# Patient Record
Sex: Female | Born: 1950 | Race: White | Hispanic: No | Marital: Married | Smoking: Former smoker
Health system: Southern US, Community
[De-identification: ages and names within clinical notes are randomized; demographics above are authoritative.]

## PROBLEM LIST (undated history)

## (undated) DIAGNOSIS — R112 Nausea with vomiting, unspecified: Secondary | ICD-10-CM

## (undated) DIAGNOSIS — Z973 Presence of spectacles and contact lenses: Secondary | ICD-10-CM

## (undated) DIAGNOSIS — H269 Unspecified cataract: Secondary | ICD-10-CM

## (undated) DIAGNOSIS — Z9889 Other specified postprocedural states: Secondary | ICD-10-CM

## (undated) DIAGNOSIS — H35039 Hypertensive retinopathy, unspecified eye: Secondary | ICD-10-CM

## (undated) DIAGNOSIS — I8390 Asymptomatic varicose veins of unspecified lower extremity: Secondary | ICD-10-CM

## (undated) DIAGNOSIS — E039 Hypothyroidism, unspecified: Secondary | ICD-10-CM

## (undated) DIAGNOSIS — K5792 Diverticulitis of intestine, part unspecified, without perforation or abscess without bleeding: Secondary | ICD-10-CM

## (undated) HISTORY — PX: COLONOSCOPY: SHX174

## (undated) HISTORY — DX: Unspecified cataract: H26.9

## (undated) HISTORY — PX: ABDOMINAL HYSTERECTOMY: SHX81

## (undated) HISTORY — PX: RECTAL SURGERY: SHX760

## (undated) HISTORY — DX: Asymptomatic varicose veins of unspecified lower extremity: I83.90

## (undated) HISTORY — DX: Hypothyroidism, unspecified: E03.9

## (undated) HISTORY — DX: Hypertensive retinopathy, unspecified eye: H35.039

## (undated) HISTORY — PX: ROTATOR CUFF REPAIR: SHX139

## (undated) HISTORY — PX: APPENDECTOMY: SHX54

---
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
## 1995-01-14 HISTORY — PX: CHOLECYSTECTOMY: SHX55

## 1999-06-11 ENCOUNTER — Ambulatory Visit (HOSPITAL_COMMUNITY): Admission: RE | Admit: 1999-06-11 | Discharge: 1999-06-11 | Payer: Self-pay | Admitting: Gastroenterology

## 2000-03-31 ENCOUNTER — Encounter: Payer: Self-pay | Admitting: Family Medicine

## 2000-03-31 ENCOUNTER — Encounter: Admission: RE | Admit: 2000-03-31 | Discharge: 2000-03-31 | Payer: Self-pay | Admitting: Family Medicine

## 2001-04-21 ENCOUNTER — Other Ambulatory Visit: Admission: RE | Admit: 2001-04-21 | Discharge: 2001-04-21 | Payer: Self-pay | Admitting: Obstetrics and Gynecology

## 2003-04-27 ENCOUNTER — Encounter: Admission: RE | Admit: 2003-04-27 | Discharge: 2003-04-27 | Payer: Self-pay | Admitting: Family Medicine

## 2004-07-02 ENCOUNTER — Other Ambulatory Visit: Admission: RE | Admit: 2004-07-02 | Discharge: 2004-07-02 | Payer: Self-pay | Admitting: Obstetrics and Gynecology

## 2006-05-07 ENCOUNTER — Encounter: Admission: RE | Admit: 2006-05-07 | Discharge: 2006-05-07 | Payer: Self-pay | Admitting: Family Medicine

## 2007-01-14 HISTORY — PX: OTHER SURGICAL HISTORY: SHX169

## 2007-04-19 ENCOUNTER — Encounter (INDEPENDENT_AMBULATORY_CARE_PROVIDER_SITE_OTHER): Payer: Self-pay | Admitting: Surgery

## 2007-04-19 ENCOUNTER — Encounter: Admission: RE | Admit: 2007-04-19 | Discharge: 2007-04-19 | Payer: Self-pay | Admitting: Family Medicine

## 2007-04-19 ENCOUNTER — Observation Stay (HOSPITAL_COMMUNITY): Admission: EM | Admit: 2007-04-19 | Discharge: 2007-04-20 | Payer: Self-pay | Admitting: Emergency Medicine

## 2007-07-01 ENCOUNTER — Ambulatory Visit (HOSPITAL_COMMUNITY): Admission: RE | Admit: 2007-07-01 | Discharge: 2007-07-02 | Payer: Self-pay | Admitting: Obstetrics and Gynecology

## 2007-07-01 ENCOUNTER — Encounter (INDEPENDENT_AMBULATORY_CARE_PROVIDER_SITE_OTHER): Payer: Self-pay | Admitting: Obstetrics and Gynecology

## 2009-07-25 ENCOUNTER — Encounter: Admission: RE | Admit: 2009-07-25 | Discharge: 2009-07-25 | Payer: Self-pay | Admitting: Family Medicine

## 2010-02-26 ENCOUNTER — Other Ambulatory Visit: Payer: Self-pay | Admitting: Family Medicine

## 2010-02-26 DIAGNOSIS — K760 Fatty (change of) liver, not elsewhere classified: Secondary | ICD-10-CM

## 2010-02-26 DIAGNOSIS — N281 Cyst of kidney, acquired: Secondary | ICD-10-CM

## 2010-03-20 ENCOUNTER — Other Ambulatory Visit: Payer: Self-pay | Admitting: Family Medicine

## 2010-03-20 DIAGNOSIS — K760 Fatty (change of) liver, not elsewhere classified: Secondary | ICD-10-CM

## 2010-03-21 ENCOUNTER — Other Ambulatory Visit: Payer: Self-pay

## 2010-03-25 ENCOUNTER — Ambulatory Visit
Admission: RE | Admit: 2010-03-25 | Discharge: 2010-03-25 | Disposition: A | Discharge status: Home / Self Care | Payer: BC Managed Care – PPO | Source: Ambulatory Visit | Attending: Family Medicine | Admitting: Family Medicine

## 2010-03-25 DIAGNOSIS — K760 Fatty (change of) liver, not elsewhere classified: Secondary | ICD-10-CM

## 2010-03-25 MED ORDER — GADOBENATE DIMEGLUMINE 529 MG/ML IV SOLN
17.0000 mL | Freq: Once | INTRAVENOUS | Status: AC | PRN
  Administered 2010-03-25: 17 mL via INTRAVENOUS

## 2010-05-28 NOTE — Op Note (Signed)
NAME:  Heather Allison, Heather Allison NO.:  [id number]   MEDICAL RECORD NO.:  [id number]          PATIENT TYPE:  OIB   LOCATION:  9315                          FACILITY:  [facility]   PHYSICIAN:  Miguel Aschoff, M.D.       DATE OF BIRTH:  Dec 30, 1950   DATE OF PROCEDURE:  07/01/2007  DATE OF DISCHARGE:                               OPERATIVE REPORT   PREOPERATIVE DIAGNOSIS:  A 5 cm right ovarian mass.   POSTOPERATIVE DIAGNOSIS:  A 5 cm right ovarian mass.   PROCEDURE:  Laparoscopic right oophorectomy and left salpingo-  oophorectomy.   SURGEON:  Miguel Aschoff, MD   ANESTHESIA:  General.   ASSISTANT:  Luvenia Redden, MD   ANESTHESIA:  General.   COMPLICATIONS:  None.   JUSTIFICATION:  The patient is a 60 year old white female who underwent  emergency appendectomy and at the time of her appendectomy was noted to  have a 5-6 cm mass involving the right ovary.  Ultrasound examination  revealed this to be a solitary cyst.  CA-125 level was normal.  Because  of the persistence of this mass and the possibility of it representing a  cystoadenoma, the patient is being brought to the operating room at this  time to undergo laparoscopic bilateral salpingo-oophorectomy due to her  menopausal state and this large right ovarian mass.  The risks and  benefits of this procedure were discussed with the patient.  In  addition, the patient has a sebaceous cyst on her left lower abdomen has  requested that this be treated at the time of her pelvic surgery.  Informed consent has been obtained.   PROCEDURE:  The patient was taken to the operating room and placed in  the supine position.  General anesthesia was administered without  difficulty.  She was then placed in modified lithotomy position, prepped  and draped in the usual sterile fashion.  The bladder was catheterized.  At this point, attention was directed to the umbilicus.  A small  infraumbilical incision was made.  A Veress needle was inserted  and then  the abdomen was insufflated with 3 L of CO2.  Following the  insufflation, the trocar to laparoscope was introduced followed by  laparoscope itself.  Then under direct visualization, a 5-mm port was  established in the right lower quadrant and an 11-mm port established in  the left lower quadrant.  Inspection revealed the presence of the right  adnexal cystic mass again approximately 5-6 cm in size somewhat adherent  to the right lateral pelvic sidewall and the appendices epiploicae of  the bowel.  On the left side, the ovary was within normal limits,  however, again adherent to the appendices epiploicae of the sigmoid  colon.  At this point, the adhesions holding the appendices epiploicae  to the ovaries were grasped with the Gyrus unit, cauterized and cut  freeing the ovaries and on the right side, the infundibulopelvic  ligament was identified.  The ovary elevated and placed on stretch.  The  ureter identified with the ureter being out of the field.  The  infundibulopelvic ligament  was cauterized and cut and dissection  continued along the  mesovarian ligament with the Gyrus unit with serial  cauterization and cuts.  There were filmy adhesions holding the ovary to  the lateral side wall and it was possible to free these adhesions  entirely without difficulty.  It was then possible to continue the  dissection until the ovary was entirely freed off the pelvic sidewall  via cauterization and cuts.  There was excellent hemostasis in this  region.  The ovaries were then placed in the cul-de-sac.  Attention was  then directed to the right tube and ovary.  The residual adhesions  holding the ovary and tube place were then grasped, elevated, cauterized  and cut and then once this was done, the infundibulopelvic ligament  again was identified as was the ureter and then the infundibular pelvic  ligaments were cauterized, cut and dissection continued again along the  mesovarian ligament  until it was possible to free the ovary entirely.  This ovary and tube were then placed in the cul-de-sac.  Inspection was  then made for hemostasis.  There was a small amount of oozing noted in  area where an appendix epiploica had been freed from the ovary and this  was grasped with a Gyrus unit, cauterized and hemostasis readily  achieved.  At this point,  the Endocatch unit was introduced into the  abdomen and both ovaries were placed in the Endocatch unit and was  brought out through the left lower quadrant incision with the ovaries  now being confined in the Endocatch bag.  The cystic mass was  decompressed and then it was possible to remove the ovaries without  difficulty through the 11 mm incision.  Final inspection made of the  pelvis, it was irrigated with saline via the Nezhat unit.  Hemostasis  appeared to be excellent.  At this point, this portion of the procedure  was completed.  The CO2 was allowed to escape and small incisions were  closed.  The periumbilical incision and the right lower quadrant  incision were closed with subcuticular 3-0 Vicryl.  The left lower  quadrant incision was closed with subcutaneous 2-0 Vicryl and the skin  was closed with 3-0 Vicryl subcutaneous suture.  Then the small  sebaceous cyst noted in the left upper quadrant was then identified,  opened and the sebaceous material drained without difficulty and the  cyst taking care.  Band-Aids were applied.  At this point, the procedure  was completed.  The patient reversed from the anesthetic and taken to  recovery room in satisfactory condition.  Prior to waking the patient,  the port sites were injected with 0.25% Marcaine, total of 10 mL were  used.  The estimated intraoperative blood loss was less than 30 mL.   Plan is for the patient to be observed overnight.  She will be  discharged home on July 02, 2007.  The patient tolerated the procedure  well.      Miguel Aschoff, M.D.  Electronically  Signed     AR/MEDQ  D:  07/01/2007  T:  07/01/2007  Job:  [id number]

## 2010-05-28 NOTE — Op Note (Signed)
Heather Allison, Heather Allison NO.:  [id number]   MEDICAL RECORD NO.:  [id number]          PATIENT TYPE:  INP   LOCATION:  1334                         FACILITY:  [facility]   PHYSICIAN:  Thornton Park. Daphine Deutscher, MD  DATE OF BIRTH:  [birth year]   DATE OF PROCEDURE:  04/19/2007  DATE OF DISCHARGE:                               OPERATIVE REPORT   PREOPERATIVE DIAGNOSIS:  Acute appendicitis and 5 cm right ovarian mass.   POSTOPERATIVE DIAGNOSIS:  Acute appendicitis and 5 cm right ovarian  mass.   PROCEDURE:  Laparoscopic appendectomy   SURGEON:  Dr. Luretha Murphy.   ASSISTANT:  None.   ANESTHESIA:  General endotracheal.   Photographs so were given to the patient's husband and are on the back  of my operative note that depict this 5 cm cystic-appearing ovary on the  right side.  She has had a previous partial hysterectomy.   DESCRIPTION OF PROCEDURE:  [name] was taken to room one on Monday,  April 6 and given general anesthesia.  Her abdomen was prepped with  Techni-Care and draped sterilely.  A longitudinal incision was made down  to the umbilicus. We entered the abdomen without difficulty and inserted  the Hasson cannula.  Once insufflated, the appendix was mobilized. It  was very stuck, but it was stuck laterally and it seemed to double back  on itself and I appeared to have gotten it near the base and I went  ahead and resected it mobilizing it and then stapling across it.  However, once I started looking at that, I realized that the appendix  had somewhat of a retro extraperitoneal portion and I was able to go  back farther really to more of the base proper and mobilized it and then  divided with the Endo-GIA vascular load and then removed it with the  Harmonic scalpel.  I placed it to in a bag and  removed it.  I irrigated with saline.  No bleeding was noted.  I  repaired the umbilical defect with the angled scope looking up at the  defect.  The patient was then  decompressed.  No bleeding was noted, I  removed the trocars, closed the skin with 4-0 Vicryl.  The patient was  taken to the recovery room in satisfactory addition.      Thornton Park Daphine Deutscher, MD  Electronically Signed     MBM/MEDQ  D:  04/19/2007  T:  04/20/2007  Job:  [id number]   cc:   Miguel Aschoff, M.D.  Fax: [phone]   Gretta Arab Valentina Lucks, M.D.  Fax: [phone]

## 2010-05-31 NOTE — Procedures (Signed)
[facility]  Patient:    Heather Allison, Heather Allison                      MRN: [id number] Adm. Date:  [id number] Disc. Date: [id number] Attending:  Deneen Harts CC:         Miguel Aschoff, M.D., OB/GYN             Dr. Lupe Carney                           Procedure Report  PROCEDURE:  Colonoscopy.  INDICATION:  A 60 year old white female found to have Hemoccult-positive stool on routine examination by Dr. Tenny Craw.  Significant family history of father with colon cancer.  Patient undergoing initial neoplasia surveillance.  She is asymptomatic except for intermittent abdominal cramping.  DESCRIPTION OF PROCEDURE:  After reviewing the nature of the procedure with the patient, including potential risks and complications, and after discussing alternative methods of diagnosis and treatment, informed consent was signed.  The patient was premedicated and was given IV sedation totalling Versed 6 mg, fentanyl 75 mcg administered in divided doses prior to and during the course of the procedure.  Using an Olympus PCF-100 video colonoscope, the rectum was intubated after a normal digital examination.  The scope was inserted and advanced under direct vision around the entire length of the colon to the cecum, identified by the appendiceal orifice and the ileocecal valve.  Photo documentation obtained. Preparation was excellent throughout.  The scope was slowly withdrawn with careful inspection of the entire colon in a retrograde manner, including retroflexed view in the rectal vault.  The only abnormality noted was early hemorrhoids.  These are thought to be the probable explanation for Hemoccult-positive stool.  No evidence of neoplasia. The colon was decompressed, scope withdrawn.  The patient tolerated the procedure without difficulty, being maintained in Dayscope monitor, low-flow oxygen throughout.  Time 1, technical 1, preparation 1, total score equal to  3.  ASSESSMENT: 1. Normal colonoscopy. 2. Internal hemorrhoids, early, possible source of Hemoccult-positive stool. 3. No colorectal neoplasia.  RECOMMENDATIONS: 1. Annual Hemoccult. 2. Repeat colonoscopy in five years because of family history. 3. Rectal care p.r.n. DD:  06/11/99 TD:  06/13/99 Job: [id number]

## 2010-05-31 NOTE — Discharge Summary (Signed)
NAME:  Heather Allison, PROBASCO NO.:  [id number]   MEDICAL RECORD NO.:  [id number]          PATIENT TYPE:  OIB   LOCATION:  9315                          FACILITY:  [facility]   PHYSICIAN:  Miguel Aschoff, M.D.       DATE OF BIRTH:  1950/10/12   DATE OF ADMISSION:  07/01/2007  DATE OF DISCHARGE:  07/02/2007                               DISCHARGE SUMMARY   ADMISSION DIAGNOSIS:  Complex right adnexal mass.   BRIEF HISTORY:  The patient is a 60 year old white female who underwent  an emergency appendectomy by Dr. Wenda Low, and at the time of this  surgery, was noted to have a large mass in the right pelvis.  Ultrasound  examination revealed this to be a simple cyst.  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   HOSPITAL COURSE:  Preoperative studies were obtained, and then on July 01, 2007, under general anesthesia, a laparoscopic bilateral salpingo-  oophorectomy was carried out without difficulty.  The patient tolerated  the surgical procedure well.  She had an uncomplicated postoperative  course.  She tolerated increasing ambulation and diet well, and by the  end of the first postoperative day, she was in satisfactory condition  and able to be discharged home.   Medications for home included Darvocet-N 100 one every 4 hours as needed  for pain.  She was instructed on no heavy lifting, to call if there are  any problems such as fever, pain, or heavy bleeding.  The pathology  report on the right ovarian mass revealed it to be a benign serous cyst.  The left ovary revealed benign cortical cyst and a benign serous cyst.      Miguel Aschoff, M.D.  Electronically Signed     AR/MEDQ  D:  08/11/2007  T:  08/11/2007  Job:  [id number]

## 2010-10-08 LAB — CBC
MCHC: 35.6
MCV: 93.4
RBC: 3.77 — ABNORMAL LOW
RDW: 12.9

## 2010-10-10 LAB — APTT: aPTT: 30

## 2010-10-10 LAB — URINE MICROSCOPIC-ADD ON

## 2010-10-10 LAB — COMPREHENSIVE METABOLIC PANEL
ALT: 46 — ABNORMAL HIGH
AST: 34
Albumin: 3.7
CO2: 25
Calcium: 9.8
Chloride: 108
Creatinine, Ser: 0.7
GFR calc Af Amer: >60
Sodium: 140

## 2010-10-10 LAB — URINALYSIS, ROUTINE W REFLEX MICROSCOPIC
Glucose, UA: NEGATIVE
Leukocytes, UA: NEGATIVE
Nitrite: NEGATIVE
Specific Gravity, Urine: 1.025
pH: 5.5

## 2010-10-10 LAB — CBC
HCT: 34.3 — ABNORMAL LOW
Hemoglobin: 11.8 — ABNORMAL LOW
MCHC: 34.4
MCHC: 34.9
MCV: 95.5
MCV: 96.2
Platelets: 238
RBC: 4.2
RDW: 12.7
WBC: 9.1

## 2011-01-14 HISTORY — PX: FOOT SURGERY: SHX648

## 2011-02-25 ENCOUNTER — Other Ambulatory Visit: Payer: Self-pay | Admitting: Obstetrics and Gynecology

## 2012-07-12 ENCOUNTER — Other Ambulatory Visit: Payer: Self-pay | Admitting: Dermatology

## 2013-03-30 ENCOUNTER — Other Ambulatory Visit: Payer: Self-pay | Admitting: *Deleted

## 2013-03-30 DIAGNOSIS — I83893 Varicose veins of bilateral lower extremities with other complications: Secondary | ICD-10-CM

## 2013-05-30 ENCOUNTER — Encounter: Payer: Self-pay | Admitting: Vascular Surgery

## 2013-05-31 ENCOUNTER — Ambulatory Visit (HOSPITAL_COMMUNITY)
Admission: RE | Admit: 2013-05-31 | Discharge: 2013-05-31 | Disposition: A | Discharge status: Home / Self Care | Payer: BC Managed Care – PPO | Source: Ambulatory Visit | Attending: Vascular Surgery | Admitting: Vascular Surgery

## 2013-05-31 ENCOUNTER — Ambulatory Visit (INDEPENDENT_AMBULATORY_CARE_PROVIDER_SITE_OTHER): Payer: BC Managed Care – PPO | Admitting: Vascular Surgery

## 2013-05-31 ENCOUNTER — Encounter: Payer: Self-pay | Admitting: Vascular Surgery

## 2013-05-31 VITALS — BP 136/75 | HR 75 | Resp 16 | Ht 67.0 in | Wt 212.0 lb

## 2013-05-31 DIAGNOSIS — I8390 Asymptomatic varicose veins of unspecified lower extremity: Secondary | ICD-10-CM | POA: Insufficient documentation

## 2013-05-31 DIAGNOSIS — I83893 Varicose veins of bilateral lower extremities with other complications: Secondary | ICD-10-CM

## 2013-05-31 DIAGNOSIS — I781 Nevus, non-neoplastic: Secondary | ICD-10-CM

## 2013-05-31 NOTE — Progress Notes (Signed)
Subjective:     Patient ID: Heather Allison, female   DOB: 04/16/1950, 63 y.o.   MRN: [id number]  HPI this 63 year old female complains of her legs being "restless" in the evenings when she is sitting watching television with the legs propped up. This does not occur while walking during the daytime or while sleeping at night. It is relieved by her ambulating. She has no pain in the lower extremities. She does have occasional mild edema. She has no history of bulging varicose veins, DVT, thrombophlebitis, stasis ulcers, or bleeding. She does not wear elastic compression stockings. She has noticed bladder veins throughout both eyes and calf areas.  Past Medical History  Diagnosis Date  . Hypothyroidism   . Varicose veins     History  Substance Use Topics  . Smoking status: Former Smoker    Quit date: 01/14/2008  . Smokeless tobacco: Not on file  . Alcohol Use: No    History reviewed. No pertinent family history.  Allergies  Allergen Reactions  . Codeine   . Erythromycin     Current outpatient prescriptions:levothyroxine (SYNTHROID, LEVOTHROID) 125 MCG tablet, Take 125 mcg by mouth daily before breakfast., Disp: , Rfl:   BP 136/75  Pulse 75  Resp 16  Ht 5\' 7"  (1.702 m)  Wt 212 lb (96.163 kg)  BMI 33.20 kg/m2  Body mass index is 33.2 kg/(m^2).          Review of Systems denies chest pain, dyspnea on exertion, PND, orthopnea, hemoptysis, claudication, or any other symptoms in a complete review of systems     Objective:   Physical Exam BP 136/75  Pulse 75  Resp 16  Ht 5\' 7"  (1.702 m)  Wt 212 lb (96.163 kg)  BMI 33.20 kg/m2  Gen.-alert and oriented x3 in no apparent distress HEENT normal for age Lungs no rhonchi or wheezing Cardiovascular regular rhythm no murmurs carotid pulses 3+ palpable no bruits audible Abdomen soft nontender no palpable masses Musculoskeletal free of  major deformities Skin clear -no rashes Neurologic normal Lower extremities 3+ femoral  and dorsalis pedis pulses palpable bilaterally with no edema Patient has spider veins in the right lateral thigh and calf on the left posterior calf and popliteal fossa. Nobles and varicosities noted.  Today I ordered bilateral venous duplex exam which I reviewed and interpreted. There is no DVT. There is no superficial reflux in the greater or small saphenous veins.        Assessment:     #1 diffuse spider veins  bilateral lower extremities-asymptomatic #2 restless legs while sitting and legs in the elevated position-etiology unknown #3 no evidence of arterial venous pathology other than spider veins    Plan:     Have discussed sclerotherapy of spider veins  If the patient is interested she will notify us

## 2013-06-13 ENCOUNTER — Other Ambulatory Visit: Payer: Self-pay | Admitting: Dermatology

## 2013-08-08 ENCOUNTER — Other Ambulatory Visit: Payer: Self-pay | Admitting: Physician Assistant

## 2013-08-22 ENCOUNTER — Encounter (HOSPITAL_BASED_OUTPATIENT_CLINIC_OR_DEPARTMENT_OTHER): Payer: Self-pay | Admitting: *Deleted

## 2013-08-22 NOTE — Progress Notes (Signed)
No labs needed-no heart or resp problems

## 2013-08-24 NOTE — H&P (Signed)
[facility] [address]   [address] [place], [place] [phone] A Division of [facility]  Loreta Ave, M.D.   Robert A. Thurston Hole, M.D.   Burnell Blanks, M.D.   Eulas Post, M.D.   Lunette Stands, M.D. Jewel Baize. Eulah Pont, M.D.  Buford Dresser, M.D.  Estell Harpin, M.D.    Melina Fiddler, M.D. Mary L. Isidoro Donning, PA-C  Kirstin A. Shepperson, PA-C  Josh [place], PA-C [place], [place]  RE: [place], [place]   [phone]      DOB: December 08, 1950 INITIAL EVALUATION: 07-05-13  HPI: Story is a new patient to the office.  She is a healthy 63 year old female.  She developed sharp, catching symptoms of the right knee about six months ago after a very minor twist.  Getting worse rather than better.  Persistent medial mechanical symptoms.  Can no longer squat or pivot.  Pseudo-locking.  Not a lot of swelling.  No specific trauma.  No other joint complaints.  Otherwise good health.    Her history, general exam are reviewed and updated and included in the chart.   EXAMINATION: Specifically a healthy appearing 63 year old female.  5'7". 210 pounds.  A little bit of an antalgic gait on the right.  Neurovascularly intact in the upper and lower extremities.  Negative straight leg both sides. Negative log roll both hips.  Right knee point tender medial joint line, positive medial McMurray's.  Full motion, stable ligaments.  Not a lot of grating or crepitus.  Patellar tracking is reasonable.  No atrophy.  Neurovascularly intact distally.  Opposite left knee is a little sore medially but not that dramatic.  A little sore over the pes bursa on the right but again the joint line tenderness is most profound.    X-RAYS: 4-view standing x-ray reveals some mild narrowing in the medial compartment on flexion views, not extreme.  IMPRESSION and PLAN: Degenerative medial meniscus tear right knee.  Symptoms for six months.  Enough  mechanical symptoms to warrant investigation. MRI to look at structures.  Followup with me when that is complete.  We talked about potential for arthroscopic debridement depending on her scan.  Final decision after we see the results.  She understands and agrees.     Loreta Ave, M.D. Electronically verified by Loreta Ave, M.D. DFM:gde D 07-05-13         T 07-06-13  [facility] [address]   [address] [place], [place] [address] [phone] A Division of [facility]  Loreta Ave, M.D.   Robert A. Thurston Hole, M.D.   Burnell Blanks, M.D.   Eulas Post, M.D.   Lunette Stands, M.D Jewel Baize. Eulah Pont, M.D.  Buford Dresser, M.D.  Estell Harpin, M.D.    Melina Fiddler, M.D. Mary L. Isidoro Donning, PA-C  Kirstin A. Shepperson, PA-C  Josh [place], PA-C [place], [place]   RE: [place], [place]   [phone]      DOB: October 04, 1950 PROGRESS NOTE: 07-26-13 Emillia comes in for her left knee. MRI is complete, I reviewed the scan and report and discussed it with her. She has a radial tear posterior horn medial meniscus. A little degenerative change in the ACL with partial tear or ACL cyst. 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  EXAMINATION: Her ACL has a little more excursion but this is equal on both sides and I'm not seeing significant laxity. Continued marked medial meniscal signs.  DISPOSITION: More than 25 minutes spent face-to-face covering everything with her. Plan exam under anesthesia arthroscopy care of her meniscus tear. I don't think we need to do anything with her ACL and she understands and agrees. Paperwork complete. 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  Discussed risks benefits and possible complications and she understands.   Loreta Ave, M.D.  Electronically verified by Loreta Ave, M.D. DFM:kah D 07-26-13 T 07-27-13

## 2013-08-25 ENCOUNTER — Ambulatory Visit (HOSPITAL_BASED_OUTPATIENT_CLINIC_OR_DEPARTMENT_OTHER)
Admission: RE | Admit: 2013-08-25 | Discharge: 2013-08-25 | Disposition: A | Discharge status: Home / Self Care | Payer: BC Managed Care – PPO | Source: Ambulatory Visit | Attending: Orthopedic Surgery | Admitting: Orthopedic Surgery

## 2013-08-25 ENCOUNTER — Encounter (HOSPITAL_BASED_OUTPATIENT_CLINIC_OR_DEPARTMENT_OTHER): Payer: Self-pay | Admitting: *Deleted

## 2013-08-25 ENCOUNTER — Encounter (HOSPITAL_BASED_OUTPATIENT_CLINIC_OR_DEPARTMENT_OTHER)
Admission: RE | Disposition: A | Discharge status: Home / Self Care | Payer: Self-pay | Source: Ambulatory Visit | Attending: Orthopedic Surgery

## 2013-08-25 ENCOUNTER — Ambulatory Visit (HOSPITAL_BASED_OUTPATIENT_CLINIC_OR_DEPARTMENT_OTHER): Payer: BC Managed Care – PPO | Admitting: Anesthesiology

## 2013-08-25 ENCOUNTER — Encounter (HOSPITAL_BASED_OUTPATIENT_CLINIC_OR_DEPARTMENT_OTHER): Payer: BC Managed Care – PPO | Admitting: Anesthesiology

## 2013-08-25 DIAGNOSIS — Z87891 Personal history of nicotine dependence: Secondary | ICD-10-CM | POA: Diagnosis not present

## 2013-08-25 DIAGNOSIS — M2240 Chondromalacia patellae, unspecified knee: Secondary | ICD-10-CM | POA: Insufficient documentation

## 2013-08-25 DIAGNOSIS — X500XXA Overexertion from strenuous movement or load, initial encounter: Secondary | ICD-10-CM | POA: Insufficient documentation

## 2013-08-25 DIAGNOSIS — G2581 Restless legs syndrome: Secondary | ICD-10-CM | POA: Diagnosis not present

## 2013-08-25 DIAGNOSIS — E039 Hypothyroidism, unspecified: Secondary | ICD-10-CM | POA: Diagnosis not present

## 2013-08-25 DIAGNOSIS — Z885 Allergy status to narcotic agent status: Secondary | ICD-10-CM | POA: Diagnosis not present

## 2013-08-25 DIAGNOSIS — Z881 Allergy status to other antibiotic agents status: Secondary | ICD-10-CM | POA: Diagnosis not present

## 2013-08-25 DIAGNOSIS — IMO0002 Reserved for concepts with insufficient information to code with codable children: Secondary | ICD-10-CM | POA: Insufficient documentation

## 2013-08-25 DIAGNOSIS — I83893 Varicose veins of bilateral lower extremities with other complications: Secondary | ICD-10-CM | POA: Diagnosis not present

## 2013-08-25 DIAGNOSIS — M9420 Chondromalacia, unspecified site: Secondary | ICD-10-CM | POA: Diagnosis not present

## 2013-08-25 HISTORY — PX: KNEE ARTHROSCOPY WITH MEDIAL MENISECTOMY: SHX5651

## 2013-08-25 HISTORY — DX: Other specified postprocedural states: R11.2

## 2013-08-25 HISTORY — DX: Other specified postprocedural states: Z98.890

## 2013-08-25 HISTORY — DX: Presence of spectacles and contact lenses: Z97.3

## 2013-08-25 LAB — POCT HEMOGLOBIN-HEMACUE: HEMOGLOBIN: 14 g/dL (ref 12.0–15.0)

## 2013-08-25 SURGERY — ARTHROSCOPY, KNEE, WITH MEDIAL MENISCECTOMY
Anesthesia: General | Site: Knee | Laterality: Left

## 2013-08-25 MED ORDER — METOCLOPRAMIDE HCL 5 MG PO TABS: 5.0000 mg | ORAL_TABLET | Freq: Three times a day (TID) | ORAL | Status: DC | PRN

## 2013-08-25 MED ORDER — CHLORHEXIDINE GLUCONATE 4 % EX LIQD: 60.0000 mL | Freq: Once | CUTANEOUS | Status: DC

## 2013-08-25 MED ORDER — ONDANSETRON HCL 4 MG PO TABS: 4.0000 mg | ORAL_TABLET | Freq: Four times a day (QID) | ORAL | Status: DC | PRN

## 2013-08-25 MED ORDER — OXYCODONE-ACETAMINOPHEN 5-325 MG PO TABS: 1.0000 | ORAL_TABLET | ORAL | Status: DC | PRN

## 2013-08-25 MED ORDER — DEXAMETHASONE SODIUM PHOSPHATE 4 MG/ML IJ SOLN
INTRAMUSCULAR | Status: DC | PRN
  Administered 2013-08-25: 10 mg via INTRAVENOUS

## 2013-08-25 MED ORDER — MIDAZOLAM HCL 5 MG/5ML IJ SOLN
INTRAMUSCULAR | Status: DC | PRN
  Administered 2013-08-25: 2 mg via INTRAVENOUS

## 2013-08-25 MED ORDER — ONDANSETRON HCL 4 MG/2ML IJ SOLN: 4.0000 mg | Freq: Four times a day (QID) | INTRAMUSCULAR | Status: DC | PRN

## 2013-08-25 MED ORDER — LACTATED RINGERS IV SOLN
INTRAVENOUS | Status: DC
  Administered 2013-08-25: 09:00:00 via INTRAVENOUS

## 2013-08-25 MED ORDER — MIDAZOLAM HCL 2 MG/2ML IJ SOLN
INTRAMUSCULAR | Status: AC
  Filled 2013-08-25: qty 2

## 2013-08-25 MED ORDER — FENTANYL CITRATE 0.05 MG/ML IJ SOLN
INTRAMUSCULAR | Status: DC | PRN
  Administered 2013-08-25: 25 ug via INTRAVENOUS
  Administered 2013-08-25: 100 ug via INTRAVENOUS
  Administered 2013-08-25: 25 ug via INTRAVENOUS

## 2013-08-25 MED ORDER — METHYLPREDNISOLONE ACETATE 80 MG/ML IJ SUSP
INTRAMUSCULAR | Status: AC
  Filled 2013-08-25: qty 1

## 2013-08-25 MED ORDER — PROPOFOL INFUSION 10 MG/ML OPTIME
INTRAVENOUS | Status: DC | PRN
  Administered 2013-08-25: 200 ug/kg/min via INTRAVENOUS

## 2013-08-25 MED ORDER — LIDOCAINE HCL (CARDIAC) 20 MG/ML IV SOLN
INTRAVENOUS | Status: DC | PRN
  Administered 2013-08-25: 100 mg via INTRAVENOUS

## 2013-08-25 MED ORDER — FENTANYL CITRATE 0.05 MG/ML IJ SOLN
INTRAMUSCULAR | Status: AC
  Filled 2013-08-25: qty 4

## 2013-08-25 MED ORDER — SCOPOLAMINE 1 MG/3DAYS TD PT72
1.0000 | MEDICATED_PATCH | Freq: Once | TRANSDERMAL | Status: DC
  Administered 2013-08-25: 1.5 mg via TRANSDERMAL

## 2013-08-25 MED ORDER — HYDROMORPHONE HCL PF 1 MG/ML IJ SOLN: 0.5000 mg | INTRAMUSCULAR | Status: DC | PRN

## 2013-08-25 MED ORDER — METOCLOPRAMIDE HCL 5 MG/ML IJ SOLN: 5.0000 mg | Freq: Three times a day (TID) | INTRAMUSCULAR | Status: DC | PRN

## 2013-08-25 MED ORDER — SODIUM CHLORIDE 0.9 % IR SOLN
Status: DC | PRN
  Administered 2013-08-25: 6000 mL/h

## 2013-08-25 MED ORDER — LACTATED RINGERS IV SOLN
INTRAVENOUS | Status: DC
  Administered 2013-08-25: 08:00:00 via INTRAVENOUS

## 2013-08-25 MED ORDER — CEFAZOLIN SODIUM-DEXTROSE 2-3 GM-% IV SOLR: 2.0000 g | INTRAVENOUS | Status: DC

## 2013-08-25 MED ORDER — KETOROLAC TROMETHAMINE 30 MG/ML IJ SOLN
INTRAMUSCULAR | Status: DC | PRN
  Administered 2013-08-25: 30 mg via INTRAVENOUS

## 2013-08-25 MED ORDER — BUPIVACAINE HCL (PF) 0.5 % IJ SOLN
INTRAMUSCULAR | Status: AC
  Filled 2013-08-25: qty 30

## 2013-08-25 MED ORDER — ONDANSETRON HCL 4 MG PO TABS: 4.0000 mg | ORAL_TABLET | Freq: Three times a day (TID) | ORAL | Status: DC | PRN

## 2013-08-25 MED ORDER — MIDAZOLAM HCL 2 MG/2ML IJ SOLN: 1.0000 mg | INTRAMUSCULAR | Status: DC | PRN

## 2013-08-25 MED ORDER — PROPOFOL 10 MG/ML IV BOLUS
INTRAVENOUS | Status: DC | PRN
  Administered 2013-08-25: 130 mg via INTRAVENOUS

## 2013-08-25 MED ORDER — SCOPOLAMINE 1 MG/3DAYS TD PT72
MEDICATED_PATCH | TRANSDERMAL | Status: AC
  Filled 2013-08-25: qty 1

## 2013-08-25 MED ORDER — FENTANYL CITRATE 0.05 MG/ML IJ SOLN: 50.0000 ug | INTRAMUSCULAR | Status: DC | PRN

## 2013-08-25 MED ORDER — METHYLPREDNISOLONE ACETATE 80 MG/ML IJ SUSP
INTRAMUSCULAR | Status: DC | PRN
  Administered 2013-08-25: 09:00:00 via INTRA_ARTICULAR

## 2013-08-25 MED ORDER — BUPIVACAINE HCL (PF) 0.5 % IJ SOLN
INTRAMUSCULAR | Status: DC | PRN
  Administered 2013-08-25: 20 mL

## 2013-08-25 SURGICAL SUPPLY — 38 items
BANDAGE ELASTIC 6 VELCRO ST LF (GAUZE/BANDAGES/DRESSINGS) ×2 IMPLANT
BLADE CUDA 5.5 (BLADE) IMPLANT
BLADE CUDA GRT WHITE 3.5 (BLADE) IMPLANT
BLADE CUTTER GATOR 3.5 (BLADE) ×2 IMPLANT
BLADE CUTTER MENIS 5.5 (BLADE) IMPLANT
BLADE GREAT WHITE 4.2 (BLADE) ×2 IMPLANT
BUR OVAL 4.0 (BURR) IMPLANT
CANISTER SUCT 3000ML (MISCELLANEOUS) IMPLANT
CUTTER MENISCUS  4.2MM (BLADE)
CUTTER MENISCUS 4.2MM (BLADE) IMPLANT
DRAPE ARTHROSCOPY W/POUCH 90 (DRAPES) ×2 IMPLANT
DURAPREP 26ML APPLICATOR (WOUND CARE) ×2 IMPLANT
ELECT MENISCUS 165MM 90D (ELECTRODE) IMPLANT
ELECT REM PT RETURN 9FT ADLT (ELECTROSURGICAL) ×2
ELECTRODE REM PT RTRN 9FT ADLT (ELECTROSURGICAL) IMPLANT
GAUZE SPONGE 4X4 12PLY STRL (GAUZE/BANDAGES/DRESSINGS) ×3 IMPLANT
GAUZE XEROFORM 1X8 LF (GAUZE/BANDAGES/DRESSINGS) ×2 IMPLANT
GLOVE BIOGEL PI IND STRL 7.0 (GLOVE) ×1 IMPLANT
GLOVE BIOGEL PI INDICATOR 7.0 (GLOVE) ×2
GLOVE ECLIPSE 6.5 STRL STRAW (GLOVE) ×3 IMPLANT
GLOVE ORTHO TXT STRL SZ7.5 (GLOVE) ×2 IMPLANT
GOWN STRL REUS W/ TWL LRG LVL3 (GOWN DISPOSABLE) ×2 IMPLANT
GOWN STRL REUS W/ TWL XL LVL3 (GOWN DISPOSABLE) ×1 IMPLANT
GOWN STRL REUS W/TWL LRG LVL3 (GOWN DISPOSABLE) ×4
GOWN STRL REUS W/TWL XL LVL3 (GOWN DISPOSABLE) ×2
HOLDER KNEE FOAM BLUE (MISCELLANEOUS) ×2 IMPLANT
IV NS IRRIG 3000ML ARTHROMATIC (IV SOLUTION) ×4 IMPLANT
KNEE WRAP E Z 3 GEL PACK (MISCELLANEOUS) ×2 IMPLANT
MANIFOLD NEPTUNE II (INSTRUMENTS) ×2 IMPLANT
PACK ARTHROSCOPY DSU (CUSTOM PROCEDURE TRAY) ×2 IMPLANT
PACK BASIN DAY SURGERY FS (CUSTOM PROCEDURE TRAY) ×2 IMPLANT
PENCIL BUTTON HOLSTER BLD 10FT (ELECTRODE) IMPLANT
SET ARTHROSCOPY TUBING (MISCELLANEOUS) ×2
SET ARTHROSCOPY TUBING LN (MISCELLANEOUS) ×1 IMPLANT
SUT ETHILON 3 0 PS 1 (SUTURE) ×2 IMPLANT
SUT VIC AB 3-0 FS2 27 (SUTURE) IMPLANT
TOWEL OR 17X24 6PK STRL BLUE (TOWEL DISPOSABLE) ×2 IMPLANT
WATER STERILE IRR 1000ML POUR (IV SOLUTION) ×2 IMPLANT

## 2013-08-25 NOTE — Discharge Instructions (Signed)
Arthroscopic Procedure, Knee, Care After Refer to this sheet in the next few weeks. These discharge instructions provide you with general information on caring for yourself after you leave the hospital. Your health care provider may also give you specific instructions. Your treatment has been planned according to the most current medical practices available, but unavoidable complications sometimes occur. If you have any problems or questions after discharge, please call your health care provider. HOME CARE INSTRUCTIONS   Weight bearing as tolerated.  May remove bandage and replace with band-aids in 3 days.  May shower in 3 days but do not soak incisions.  May apply ice for up to 20 minutes at a time for pain and swelling.  Follow up appointment in one week   It is normal to be sore for a couple days after surgery. See your health care provider if this seems to be getting worse rather than better.  Only take over-the-counter or prescription medicines for pain, discomfort, or fever as directed by your health care provider.  Take showers rather than baths, or as directed by your health care provider.  Change bandages (dressings) if necessary or as directed.  You may resume normal diet and activities as directed or allowed.  Avoid lifting and driving until you are directed otherwise.  Make an appointment to see your health care provider for stitches (suture) or staple removal as directed.  You may put ice on the area.  Put the ice in a plastic bag. Place a towel between your skin and the bag.  Leave the ice on for 15-20 minutes, three to four times per day for the first 2 days.  Elevate the knee above the level of your heart to reduce swelling, and avoid dangling the leg.  Do 10-15 ankle pumps (pointing your toes toward you and then away from you) two to three times daily.  If you are given compression stockings to wear after surgery, use them for as long as your surgeon tells you (around  10-14 days).  Avoid smoking and exposure to secondhand smoke. SEEK MEDICAL CARE IF:   You have increased bleeding from your wounds.  You see redness or swelling or you have increasing pain in your wounds.  You have pus coming from your wound.  You have a fever or persistent symptoms for more than 2-3 days.  You notice a bad smell coming from the wound or dressing.  You have severe pain with any motion of your knee. SEEK IMMEDIATE MEDICAL CARE IF:   You develop a rash.  You have difficulty breathing.  You develop any reaction or side effects to medicines taken.  You develop pain in the calves or back of the knee.  You develop chest pain, shortness of breath, or difficulty breathing.  You develop numbness or tingling in the leg or foot. MAKE SURE YOU:   Understand these instructions.  Will watch your condition.  Will get help right away if you are not doing well or you get worse. Document Released: 07/19/2004 Document Revised: 01/04/2013 Document Reviewed: 05/27/2012 [facility] Patient Information 2015 [place], [place]. This information is not intended to replace advice given to you by your health care provider. Make sure you discuss any questions you have with your health care provider.    Post Anesthesia Home Care Instructions  Activity: Get plenty of rest for the remainder of the day. A responsible adult should stay with you for 24 hours following the procedure.  For the next 24 hours, DO NOT: -  Drive a car -Paediatric nurse -Drink alcoholic beverages -Take any medication unless instructed by your physician -Make any legal decisions or sign important papers.  Meals: Start with liquid foods such as gelatin or soup. Progress to regular foods as tolerated. Avoid greasy, spicy, heavy foods. If nausea and/or vomiting occur, drink only clear liquids until the nausea and/or vomiting subsides. Call your physician if vomiting continues.  Special  Instructions/Symptoms: Your throat may feel dry or sore from the anesthesia or the breathing tube placed in your throat during surgery. If this causes discomfort, gargle with warm salt water. The discomfort should disappear within 24 hours.

## 2013-08-25 NOTE — Interval H&P Note (Signed)
History and Physical Interval Note:  08/25/2013 7:32 AM  Heather Allison  has presented today for surgery, with the diagnosis of LEFT KNEE [name]  The various methods of treatment have been discussed with the patient and family. After consideration of risks, benefits and other options for treatment, the patient has consented to  Procedure(s): KNEE ARTHROSCOPY WITH DEBRIDEMENT/SHAVING (CHONDROPLASTY),WITH MENISCECTOMY MEDIAL (Left) as a surgical intervention .  The patient's history has been reviewed, patient examined, no change in status, stable for surgery.  I have reviewed the patient's chart and labs.  Questions were answered to the patient's satisfaction.     [name]

## 2013-08-25 NOTE — Anesthesia Procedure Notes (Signed)
Procedure Name: LMA Insertion Performed by: Terrance Mass Pre-anesthesia Checklist: Patient identified, Timeout performed, Emergency Drugs available, Suction available and Patient being monitored Patient Re-evaluated:Patient Re-evaluated prior to inductionOxygen Delivery Method: Circle system utilized Preoxygenation: Pre-oxygenation with 100% oxygen Intubation Type: IV induction Ventilation: Mask ventilation without difficulty LMA: LMA with gastric port inserted LMA Size: 4.0 Number of attempts: 1 Placement Confirmation: positive ETCO2 Tube secured with: Tape Dental Injury: Teeth and Oropharynx as per pre-operative assessment

## 2013-08-25 NOTE — Transfer of Care (Signed)
Immediate Anesthesia Transfer of Care Note  Patient: Heather Allison  Procedure(s) Performed: Procedure(s): KNEE ARTHROSCOPY WITH DEBRIDEMENT/SHAVING (CHONDROPLASTY),WITH MENISCECTOMY MEDIAL (Left)  Patient Location: PACU  Anesthesia Type:General  Level of Consciousness: awake and sedated  Airway & Oxygen Therapy: Patient Spontanous Breathing and Patient connected to face mask oxygen  Post-op Assessment: Report given to PACU RN and Post -op Vital signs reviewed and stable  Post vital signs: Reviewed and stable  Complications: No apparent anesthesia complications

## 2013-08-25 NOTE — Anesthesia Postprocedure Evaluation (Signed)
  Anesthesia Post-op Note  Patient: Heather Allison  Procedure(s) Performed: Procedure(s): KNEE ARTHROSCOPY WITH DEBRIDEMENT/SHAVING (CHONDROPLASTY),WITH MENISCECTOMY MEDIAL (Left)  Patient Location: PACU  Anesthesia Type:General  Level of Consciousness: awake and alert   Airway and Oxygen Therapy: Patient Spontanous Breathing  Post-op Pain: moderate  Post-op Assessment: Post-op Vital signs reviewed, Patient's Cardiovascular Status Stable and Respiratory Function Stable  Post-op Vital Signs: Reviewed  Filed Vitals:   08/25/13 1030  BP: 139/61  Pulse: 75  Temp:   Resp: 19    Complications: No apparent anesthesia complications

## 2013-08-25 NOTE — Anesthesia Preprocedure Evaluation (Signed)
Anesthesia Evaluation  Patient identified by MRN, date of birth, ID band Patient awake    Reviewed: Allergy & Precautions, H&P , NPO status , Patient's Chart, lab work & pertinent test results  History of Anesthesia Complications (+) PONV and history of anesthetic complications  Airway Mallampati: II TM Distance: >3 FB Neck ROM: Full    Dental no notable dental hx. (+) Teeth Intact, Dental Advisory Given   Pulmonary neg pulmonary ROS, former smoker,  breath sounds clear to auscultation  Pulmonary exam normal       Cardiovascular negative cardio ROS  Rhythm:Regular Rate:Normal     Neuro/Psych negative neurological ROS  negative psych ROS   GI/Hepatic negative GI ROS, Neg liver ROS,   Endo/Other  Hypothyroidism   Renal/GU negative Renal ROS  negative genitourinary   Musculoskeletal   Abdominal   Peds  Hematology negative hematology ROS (+)   Anesthesia Other Findings   Reproductive/Obstetrics negative OB ROS                           Anesthesia Physical Anesthesia Plan  ASA: II  Anesthesia Plan: General   Post-op Pain Management:    Induction: Intravenous  Airway Management Planned: LMA  Additional Equipment:   Intra-op Plan:   Post-operative Plan: Extubation in OR  Informed Consent: I have reviewed the patients History and Physical, chart, labs and discussed the procedure including the risks, benefits and alternatives for the proposed anesthesia with the patient or authorized representative who has indicated his/her understanding and acceptance.   Dental advisory given  Plan Discussed with: CRNA  Anesthesia Plan Comments:         Anesthesia Quick Evaluation

## 2013-08-26 ENCOUNTER — Encounter (HOSPITAL_BASED_OUTPATIENT_CLINIC_OR_DEPARTMENT_OTHER): Payer: Self-pay | Admitting: Orthopedic Surgery

## 2013-08-29 NOTE — Op Note (Signed)
Heather Allison, Heather Allison NO.:  [id number]  MEDICAL RECORD NO.:  [id number]  LOCATION:                                 FACILITY:  PHYSICIAN:  Ninetta Lights, M.D. DATE OF BIRTH:  07/20/1950  DATE OF PROCEDURE:  08/25/2013 DATE OF DISCHARGE:  08/25/2013                              OPERATIVE REPORT   PREOPERATIVE DIAGNOSIS:  Left knee medial meniscus tear.  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  PROCEDURE:  Left knee exam under anesthesia, arthroscopy.  Partial medial meniscectomy.  Chondroplasty medial compartment patella.  SURGEON:  Ninetta Lights, M.D.  ASSISTANT:  Doran Stabler, PA.  ANESTHESIA:  General.  BLOOD LOSS:  Minimal.  SPECIMENS:  None.  COMPLICATIONS:  None.  DRESSINGS:  Soft compressive.  DESCRIPTION OF PROCEDURE:  The patient brought to the operating room, placed on operative table supine position.  After adequate anesthesia had been obtained, leg holder applied.  Leg prepped and draped in usual sterile fashion.  Two portals, one each medial and lateral parapatellar. Arthroscope was induced, knee distended and inspected.  Good patellar tracking.  Mild chondromalacia peak of the patella debrided.  ACL, a little bit of attrition but functional intact.  Lateral compartment. Lateral meniscus normal.  Medial meniscus, detached posterior horn with displaced meniscus and tearing of the posterior third.  Taken out to a stable rim, tapered into remaining meniscus.  Some grade 2 to mild grade 3 changes on the condyle, on the plateau both debrided.  The entire knee examined.  No other findings were appreciated.  Instruments were removed.  Knee injected with Marcaine.  Portals were closed with nylon. Sterile compressive dressing applied.  Anesthesia reversed and brought to the recovery room.  Tolerated the surgery  well.  No complications.     Ninetta Lights, M.D.     DFM/MEDQ  D:  08/25/2013  T:  08/25/2013  Job:  [id number]

## 2014-04-12 ENCOUNTER — Other Ambulatory Visit: Payer: Self-pay | Admitting: Obstetrics and Gynecology

## 2014-04-13 LAB — CYTOLOGY - PAP

## 2014-07-12 ENCOUNTER — Other Ambulatory Visit: Payer: Self-pay | Admitting: Orthopedic Surgery

## 2014-07-12 DIAGNOSIS — R52 Pain, unspecified: Secondary | ICD-10-CM

## 2014-07-26 ENCOUNTER — Other Ambulatory Visit: Payer: Self-pay

## 2014-08-02 ENCOUNTER — Ambulatory Visit
Admission: RE | Admit: 2014-08-02 | Discharge: 2014-08-02 | Disposition: A | Discharge status: Home / Self Care | Payer: BLUE CROSS/BLUE SHIELD | Source: Ambulatory Visit | Attending: Orthopedic Surgery | Admitting: Orthopedic Surgery

## 2014-08-02 DIAGNOSIS — R52 Pain, unspecified: Secondary | ICD-10-CM

## 2016-09-30 ENCOUNTER — Other Ambulatory Visit: Payer: Self-pay | Admitting: Orthopaedic Surgery

## 2016-09-30 DIAGNOSIS — M25512 Pain in left shoulder: Principal | ICD-10-CM

## 2016-09-30 DIAGNOSIS — G8929 Other chronic pain: Secondary | ICD-10-CM

## 2016-10-11 ENCOUNTER — Ambulatory Visit
Admission: RE | Admit: 2016-10-11 | Discharge: 2016-10-11 | Disposition: A | Discharge status: Home / Self Care | Payer: 59 | Source: Ambulatory Visit | Attending: Orthopaedic Surgery | Admitting: Orthopaedic Surgery

## 2016-10-11 DIAGNOSIS — M25512 Pain in left shoulder: Principal | ICD-10-CM

## 2016-10-11 DIAGNOSIS — G8929 Other chronic pain: Secondary | ICD-10-CM

## 2017-07-19 DIAGNOSIS — Z800 Family history of malignant neoplasm of digestive organs: Secondary | ICD-10-CM | POA: Insufficient documentation

## 2017-08-27 DIAGNOSIS — E039 Hypothyroidism, unspecified: Secondary | ICD-10-CM | POA: Diagnosis not present

## 2017-09-08 DIAGNOSIS — L821 Other seborrheic keratosis: Secondary | ICD-10-CM | POA: Diagnosis not present

## 2017-09-08 DIAGNOSIS — L570 Actinic keratosis: Secondary | ICD-10-CM | POA: Diagnosis not present

## 2017-09-08 DIAGNOSIS — L820 Inflamed seborrheic keratosis: Secondary | ICD-10-CM | POA: Diagnosis not present

## 2017-09-08 DIAGNOSIS — D692 Other nonthrombocytopenic purpura: Secondary | ICD-10-CM | POA: Diagnosis not present

## 2017-10-20 DIAGNOSIS — M9903 Segmental and somatic dysfunction of lumbar region: Secondary | ICD-10-CM | POA: Diagnosis not present

## 2017-10-20 DIAGNOSIS — M25511 Pain in right shoulder: Secondary | ICD-10-CM | POA: Diagnosis not present

## 2017-10-20 DIAGNOSIS — M9902 Segmental and somatic dysfunction of thoracic region: Secondary | ICD-10-CM | POA: Diagnosis not present

## 2017-10-20 DIAGNOSIS — M25551 Pain in right hip: Secondary | ICD-10-CM | POA: Diagnosis not present

## 2017-10-20 DIAGNOSIS — M9901 Segmental and somatic dysfunction of cervical region: Secondary | ICD-10-CM | POA: Diagnosis not present

## 2017-10-20 DIAGNOSIS — M62830 Muscle spasm of back: Secondary | ICD-10-CM | POA: Diagnosis not present

## 2017-10-23 ENCOUNTER — Other Ambulatory Visit: Payer: Self-pay | Admitting: Orthopaedic Surgery

## 2017-10-23 DIAGNOSIS — M545 Low back pain, unspecified: Secondary | ICD-10-CM

## 2017-10-27 ENCOUNTER — Ambulatory Visit
Admission: RE | Admit: 2017-10-27 | Discharge: 2017-10-27 | Disposition: A | Discharge status: Home / Self Care | Payer: Medicare HMO | Source: Ambulatory Visit | Attending: Orthopaedic Surgery | Admitting: Orthopaedic Surgery

## 2017-10-27 DIAGNOSIS — M545 Low back pain, unspecified: Secondary | ICD-10-CM

## 2017-10-27 DIAGNOSIS — M5117 Intervertebral disc disorders with radiculopathy, lumbosacral region: Secondary | ICD-10-CM | POA: Diagnosis not present

## 2017-10-27 DIAGNOSIS — M5116 Intervertebral disc disorders with radiculopathy, lumbar region: Secondary | ICD-10-CM | POA: Diagnosis not present

## 2017-10-27 DIAGNOSIS — M4726 Other spondylosis with radiculopathy, lumbar region: Secondary | ICD-10-CM | POA: Diagnosis not present

## 2017-10-28 ENCOUNTER — Other Ambulatory Visit: Payer: Self-pay | Admitting: Neurosurgery

## 2017-10-28 DIAGNOSIS — M5416 Radiculopathy, lumbar region: Secondary | ICD-10-CM | POA: Diagnosis not present

## 2017-10-28 DIAGNOSIS — M5126 Other intervertebral disc displacement, lumbar region: Secondary | ICD-10-CM

## 2017-10-29 ENCOUNTER — Ambulatory Visit
Admission: RE | Admit: 2017-10-29 | Discharge: 2017-10-29 | Disposition: A | Discharge status: Home / Self Care | Payer: Medicare HMO | Source: Ambulatory Visit | Attending: Neurosurgery | Admitting: Neurosurgery

## 2017-10-29 DIAGNOSIS — M5126 Other intervertebral disc displacement, lumbar region: Secondary | ICD-10-CM | POA: Diagnosis not present

## 2017-10-29 MED ORDER — IOPAMIDOL (ISOVUE-M 200) INJECTION 41%
1.0000 mL | Freq: Once | INTRAMUSCULAR | Status: AC
  Administered 2017-10-29: 1 mL via EPIDURAL

## 2017-10-29 MED ORDER — METHYLPREDNISOLONE ACETATE 40 MG/ML INJ SUSP (RADIOLOG
120.0000 mg | Freq: Once | INTRAMUSCULAR | Status: AC
  Administered 2017-10-29: 120 mg via EPIDURAL

## 2017-10-29 NOTE — Discharge Instructions (Signed)

## 2017-10-31 ENCOUNTER — Other Ambulatory Visit: Payer: 59

## 2017-11-09 DIAGNOSIS — Z6834 Body mass index (BMI) 34.0-34.9, adult: Secondary | ICD-10-CM | POA: Diagnosis not present

## 2017-11-09 DIAGNOSIS — R030 Elevated blood-pressure reading, without diagnosis of hypertension: Secondary | ICD-10-CM | POA: Diagnosis not present

## 2017-11-09 DIAGNOSIS — M5126 Other intervertebral disc displacement, lumbar region: Secondary | ICD-10-CM | POA: Diagnosis not present

## 2017-11-10 ENCOUNTER — Other Ambulatory Visit: Payer: Self-pay | Admitting: Neurosurgery

## 2017-11-10 DIAGNOSIS — M5126 Other intervertebral disc displacement, lumbar region: Secondary | ICD-10-CM

## 2017-11-18 ENCOUNTER — Ambulatory Visit
Admission: RE | Admit: 2017-11-18 | Discharge: 2017-11-18 | Disposition: A | Discharge status: Home / Self Care | Payer: Medicare HMO | Source: Ambulatory Visit | Attending: Neurosurgery | Admitting: Neurosurgery

## 2017-11-18 DIAGNOSIS — M5126 Other intervertebral disc displacement, lumbar region: Secondary | ICD-10-CM

## 2017-11-18 MED ORDER — IOPAMIDOL (ISOVUE-M 200) INJECTION 41%
1.0000 mL | Freq: Once | INTRAMUSCULAR | Status: AC
  Administered 2017-11-18: 1 mL via EPIDURAL

## 2017-11-18 MED ORDER — METHYLPREDNISOLONE ACETATE 40 MG/ML INJ SUSP (RADIOLOG
120.0000 mg | Freq: Once | INTRAMUSCULAR | Status: AC
  Administered 2017-11-18: 120 mg via EPIDURAL

## 2017-11-18 NOTE — Discharge Instructions (Signed)

## 2017-11-23 DIAGNOSIS — R69 Illness, unspecified: Secondary | ICD-10-CM | POA: Diagnosis not present

## 2017-12-02 DIAGNOSIS — M5126 Other intervertebral disc displacement, lumbar region: Secondary | ICD-10-CM | POA: Diagnosis not present

## 2017-12-02 DIAGNOSIS — Z6834 Body mass index (BMI) 34.0-34.9, adult: Secondary | ICD-10-CM | POA: Diagnosis not present

## 2017-12-02 DIAGNOSIS — R030 Elevated blood-pressure reading, without diagnosis of hypertension: Secondary | ICD-10-CM | POA: Diagnosis not present

## 2017-12-21 DIAGNOSIS — Z800 Family history of malignant neoplasm of digestive organs: Secondary | ICD-10-CM | POA: Diagnosis not present

## 2017-12-21 DIAGNOSIS — Z1159 Encounter for screening for other viral diseases: Secondary | ICD-10-CM | POA: Diagnosis not present

## 2017-12-21 DIAGNOSIS — Z0000 Encounter for general adult medical examination without abnormal findings: Secondary | ICD-10-CM | POA: Diagnosis not present

## 2017-12-21 DIAGNOSIS — E039 Hypothyroidism, unspecified: Secondary | ICD-10-CM | POA: Diagnosis not present

## 2017-12-21 DIAGNOSIS — M5126 Other intervertebral disc displacement, lumbar region: Secondary | ICD-10-CM | POA: Diagnosis not present

## 2017-12-21 DIAGNOSIS — Z23 Encounter for immunization: Secondary | ICD-10-CM | POA: Diagnosis not present

## 2017-12-21 DIAGNOSIS — E782 Mixed hyperlipidemia: Secondary | ICD-10-CM | POA: Diagnosis not present

## 2017-12-21 DIAGNOSIS — G720 Drug-induced myopathy: Secondary | ICD-10-CM | POA: Diagnosis not present

## 2017-12-21 DIAGNOSIS — R69 Illness, unspecified: Secondary | ICD-10-CM | POA: Diagnosis not present

## 2017-12-21 DIAGNOSIS — M5136 Other intervertebral disc degeneration, lumbar region: Secondary | ICD-10-CM | POA: Diagnosis not present

## 2017-12-31 DIAGNOSIS — Z1231 Encounter for screening mammogram for malignant neoplasm of breast: Secondary | ICD-10-CM | POA: Diagnosis not present

## 2018-01-08 DIAGNOSIS — R922 Inconclusive mammogram: Secondary | ICD-10-CM | POA: Diagnosis not present

## 2018-01-08 DIAGNOSIS — N6489 Other specified disorders of breast: Secondary | ICD-10-CM | POA: Diagnosis not present

## 2018-01-13 HISTORY — PX: BACK SURGERY: SHX140

## 2018-01-19 DIAGNOSIS — M5126 Other intervertebral disc displacement, lumbar region: Secondary | ICD-10-CM | POA: Diagnosis not present

## 2018-03-26 DIAGNOSIS — R52 Pain, unspecified: Secondary | ICD-10-CM | POA: Diagnosis not present

## 2018-03-26 DIAGNOSIS — J069 Acute upper respiratory infection, unspecified: Secondary | ICD-10-CM | POA: Diagnosis not present

## 2018-05-24 DIAGNOSIS — L820 Inflamed seborrheic keratosis: Secondary | ICD-10-CM | POA: Diagnosis not present

## 2018-05-24 DIAGNOSIS — D22121 Melanocytic nevi of left upper eyelid, including canthus: Secondary | ICD-10-CM | POA: Diagnosis not present

## 2018-05-24 DIAGNOSIS — L821 Other seborrheic keratosis: Secondary | ICD-10-CM | POA: Diagnosis not present

## 2018-05-24 DIAGNOSIS — L814 Other melanin hyperpigmentation: Secondary | ICD-10-CM | POA: Diagnosis not present

## 2018-05-24 DIAGNOSIS — L570 Actinic keratosis: Secondary | ICD-10-CM | POA: Diagnosis not present

## 2018-05-31 DIAGNOSIS — M5126 Other intervertebral disc displacement, lumbar region: Secondary | ICD-10-CM | POA: Diagnosis not present

## 2018-06-22 DIAGNOSIS — D485 Neoplasm of uncertain behavior of skin: Secondary | ICD-10-CM | POA: Diagnosis not present

## 2018-06-29 DIAGNOSIS — C44729 Squamous cell carcinoma of skin of left lower limb, including hip: Secondary | ICD-10-CM | POA: Diagnosis not present

## 2018-06-29 DIAGNOSIS — Z85828 Personal history of other malignant neoplasm of skin: Secondary | ICD-10-CM | POA: Diagnosis not present

## 2018-08-12 DIAGNOSIS — Z85828 Personal history of other malignant neoplasm of skin: Secondary | ICD-10-CM | POA: Diagnosis not present

## 2018-08-12 DIAGNOSIS — C44729 Squamous cell carcinoma of skin of left lower limb, including hip: Secondary | ICD-10-CM | POA: Diagnosis not present

## 2018-09-06 DIAGNOSIS — R69 Illness, unspecified: Secondary | ICD-10-CM | POA: Diagnosis not present

## 2018-09-14 DIAGNOSIS — K5792 Diverticulitis of intestine, part unspecified, without perforation or abscess without bleeding: Secondary | ICD-10-CM

## 2018-09-14 HISTORY — DX: Diverticulitis of intestine, part unspecified, without perforation or abscess without bleeding: K57.92

## 2018-09-21 ENCOUNTER — Other Ambulatory Visit: Payer: Self-pay

## 2018-09-21 ENCOUNTER — Encounter (HOSPITAL_COMMUNITY): Payer: Self-pay | Admitting: Emergency Medicine

## 2018-09-21 ENCOUNTER — Inpatient Hospital Stay (HOSPITAL_COMMUNITY)
Admission: EM | Admit: 2018-09-21 | Discharge: 2018-09-24 | DRG: 392 | Disposition: A | Discharge status: Home / Self Care | Payer: Medicare HMO | Attending: General Surgery | Admitting: General Surgery

## 2018-09-21 ENCOUNTER — Emergency Department (HOSPITAL_COMMUNITY)
Admission: EM | Admit: 2018-09-21 | Discharge: 2018-09-21 | Disposition: A | Discharge status: Home / Self Care | Payer: Medicare HMO | Source: Home / Self Care

## 2018-09-21 DIAGNOSIS — Z20828 Contact with and (suspected) exposure to other viral communicable diseases: Secondary | ICD-10-CM | POA: Diagnosis not present

## 2018-09-21 DIAGNOSIS — Z885 Allergy status to narcotic agent status: Secondary | ICD-10-CM

## 2018-09-21 DIAGNOSIS — Z87891 Personal history of nicotine dependence: Secondary | ICD-10-CM

## 2018-09-21 DIAGNOSIS — R1084 Generalized abdominal pain: Secondary | ICD-10-CM | POA: Diagnosis not present

## 2018-09-21 DIAGNOSIS — E876 Hypokalemia: Secondary | ICD-10-CM | POA: Diagnosis present

## 2018-09-21 DIAGNOSIS — K5732 Diverticulitis of large intestine without perforation or abscess without bleeding: Secondary | ICD-10-CM | POA: Diagnosis present

## 2018-09-21 DIAGNOSIS — Z7984 Long term (current) use of oral hypoglycemic drugs: Secondary | ICD-10-CM

## 2018-09-21 DIAGNOSIS — Z5321 Procedure and treatment not carried out due to patient leaving prior to being seen by health care provider: Secondary | ICD-10-CM | POA: Insufficient documentation

## 2018-09-21 DIAGNOSIS — Z800 Family history of malignant neoplasm of digestive organs: Secondary | ICD-10-CM

## 2018-09-21 DIAGNOSIS — K5720 Diverticulitis of large intestine with perforation and abscess without bleeding: Secondary | ICD-10-CM | POA: Diagnosis not present

## 2018-09-21 DIAGNOSIS — Z881 Allergy status to other antibiotic agents status: Secondary | ICD-10-CM

## 2018-09-21 DIAGNOSIS — Z90710 Acquired absence of both cervix and uterus: Secondary | ICD-10-CM

## 2018-09-21 DIAGNOSIS — E039 Hypothyroidism, unspecified: Secondary | ICD-10-CM | POA: Diagnosis present

## 2018-09-21 DIAGNOSIS — R1032 Left lower quadrant pain: Secondary | ICD-10-CM | POA: Diagnosis not present

## 2018-09-21 DIAGNOSIS — Z79890 Hormone replacement therapy (postmenopausal): Secondary | ICD-10-CM

## 2018-09-21 DIAGNOSIS — Z79899 Other long term (current) drug therapy: Secondary | ICD-10-CM

## 2018-09-21 DIAGNOSIS — Z91040 Latex allergy status: Secondary | ICD-10-CM

## 2018-09-21 DIAGNOSIS — R1909 Other intra-abdominal and pelvic swelling, mass and lump: Secondary | ICD-10-CM | POA: Diagnosis present

## 2018-09-21 DIAGNOSIS — R51 Headache: Secondary | ICD-10-CM | POA: Diagnosis not present

## 2018-09-21 DIAGNOSIS — Z9049 Acquired absence of other specified parts of digestive tract: Secondary | ICD-10-CM

## 2018-09-21 LAB — URINALYSIS, ROUTINE W REFLEX MICROSCOPIC
Bilirubin Urine: NEGATIVE
Glucose, UA: NEGATIVE mg/dL
Ketones, ur: NEGATIVE mg/dL
Nitrite: NEGATIVE
Protein, ur: 100 mg/dL — AB
Specific Gravity, Urine: 1.031 — ABNORMAL HIGH (ref 1.005–1.030)
pH: 5 (ref 5.0–8.0)

## 2018-09-21 NOTE — ED Notes (Signed)
No answer for vitals  

## 2018-09-21 NOTE — ED Triage Notes (Signed)
C/O of abdominal cramping. Sent by [facility] for Abdominal CT scan. All lab work completed at [facility], pt has copy of results in hand. Pt failed to answer multiple calls for room earlier and d/c as LWBS.

## 2018-09-21 NOTE — ED Triage Notes (Signed)
Patient sent by doctor for further evaluation of abdominal pain x 2 days accompanied by bloating. Patient states pain feels like gas pains, but no relief with OTC meds. Denies N/V/D, urinary symptoms, fevers/chills, but had blood work at PCP office today that revealed leukocytosis. Last BM yesterday.

## 2018-09-22 ENCOUNTER — Emergency Department (HOSPITAL_COMMUNITY): Payer: Medicare HMO

## 2018-09-22 ENCOUNTER — Encounter (HOSPITAL_COMMUNITY): Payer: Self-pay | Admitting: General Practice

## 2018-09-22 DIAGNOSIS — K5732 Diverticulitis of large intestine without perforation or abscess without bleeding: Secondary | ICD-10-CM | POA: Diagnosis present

## 2018-09-22 DIAGNOSIS — Z881 Allergy status to other antibiotic agents status: Secondary | ICD-10-CM | POA: Diagnosis not present

## 2018-09-22 DIAGNOSIS — Z9049 Acquired absence of other specified parts of digestive tract: Secondary | ICD-10-CM | POA: Diagnosis not present

## 2018-09-22 DIAGNOSIS — Z91040 Latex allergy status: Secondary | ICD-10-CM | POA: Diagnosis not present

## 2018-09-22 DIAGNOSIS — Z90710 Acquired absence of both cervix and uterus: Secondary | ICD-10-CM | POA: Diagnosis not present

## 2018-09-22 DIAGNOSIS — R109 Unspecified abdominal pain: Secondary | ICD-10-CM | POA: Diagnosis not present

## 2018-09-22 DIAGNOSIS — Z87891 Personal history of nicotine dependence: Secondary | ICD-10-CM | POA: Diagnosis not present

## 2018-09-22 DIAGNOSIS — Z79899 Other long term (current) drug therapy: Secondary | ICD-10-CM | POA: Diagnosis not present

## 2018-09-22 DIAGNOSIS — E876 Hypokalemia: Secondary | ICD-10-CM | POA: Diagnosis not present

## 2018-09-22 DIAGNOSIS — Z885 Allergy status to narcotic agent status: Secondary | ICD-10-CM | POA: Diagnosis not present

## 2018-09-22 DIAGNOSIS — K5720 Diverticulitis of large intestine with perforation and abscess without bleeding: Secondary | ICD-10-CM | POA: Diagnosis not present

## 2018-09-22 DIAGNOSIS — Z800 Family history of malignant neoplasm of digestive organs: Secondary | ICD-10-CM | POA: Diagnosis not present

## 2018-09-22 DIAGNOSIS — R1909 Other intra-abdominal and pelvic swelling, mass and lump: Secondary | ICD-10-CM | POA: Diagnosis not present

## 2018-09-22 DIAGNOSIS — Z79890 Hormone replacement therapy (postmenopausal): Secondary | ICD-10-CM | POA: Diagnosis not present

## 2018-09-22 DIAGNOSIS — Z20828 Contact with and (suspected) exposure to other viral communicable diseases: Secondary | ICD-10-CM | POA: Diagnosis not present

## 2018-09-22 DIAGNOSIS — Z7984 Long term (current) use of oral hypoglycemic drugs: Secondary | ICD-10-CM | POA: Diagnosis not present

## 2018-09-22 DIAGNOSIS — E039 Hypothyroidism, unspecified: Secondary | ICD-10-CM | POA: Diagnosis not present

## 2018-09-22 LAB — COMPREHENSIVE METABOLIC PANEL
ALT: 23 U/L (ref 0–44)
AST: 17 U/L (ref 15–41)
Albumin: 3.8 g/dL (ref 3.5–5.0)
Alkaline Phosphatase: 78 U/L (ref 38–126)
Anion gap: 14 (ref 5–15)
BUN: 9 mg/dL (ref 8–23)
CO2: 20 mmol/L — ABNORMAL LOW (ref 22–32)
Calcium: 9.4 mg/dL (ref 8.9–10.3)
Chloride: 101 mmol/L (ref 98–111)
Creatinine, Ser: 0.76 mg/dL (ref 0.44–1.00)
GFR calc Af Amer: >60 mL/min (ref >60)
GFR calc non Af Amer: >60 mL/min (ref >60)
Glucose, Bld: 107 mg/dL — ABNORMAL HIGH (ref 70–99)
Potassium: 3.1 mmol/L — ABNORMAL LOW (ref 3.5–5.1)
Sodium: 135 mmol/L (ref 135–145)
Total Bilirubin: 1.3 mg/dL — ABNORMAL HIGH (ref 0.3–1.2)
Total Protein: 7.1 g/dL (ref 6.5–8.1)

## 2018-09-22 LAB — CBC WITH DIFFERENTIAL/PLATELET
Abs Immature Granulocytes: 0.06 10*3/uL (ref 0.00–0.07)
Basophils Absolute: 0 10*3/uL (ref 0.0–0.1)
Basophils Relative: 0 %
Eosinophils Absolute: 0 10*3/uL (ref 0.0–0.5)
Eosinophils Relative: 0 %
HCT: 40 % (ref 36.0–46.0)
Hemoglobin: 12.9 g/dL (ref 12.0–15.0)
Immature Granulocytes: 1 %
Lymphocytes Relative: 23 %
Lymphs Abs: 3 10*3/uL (ref 0.7–4.0)
MCH: 29.7 pg (ref 26.0–34.0)
MCHC: 32.3 g/dL (ref 30.0–36.0)
MCV: 92 fL (ref 80.0–100.0)
Monocytes Absolute: 1 10*3/uL (ref 0.1–1.0)
Monocytes Relative: 7 %
Neutro Abs: 9.1 10*3/uL — ABNORMAL HIGH (ref 1.7–7.7)
Neutrophils Relative %: 69 %
Platelets: 223 10*3/uL (ref 150–400)
RBC: 4.35 MIL/uL (ref 3.87–5.11)
RDW: 13.1 % (ref 11.5–15.5)
WBC: 13.2 10*3/uL — ABNORMAL HIGH (ref 4.0–10.5)
nRBC: 0 % (ref 0.0–0.2)

## 2018-09-22 LAB — URINALYSIS, ROUTINE W REFLEX MICROSCOPIC
Bilirubin Urine: NEGATIVE
Glucose, UA: NEGATIVE mg/dL
Ketones, ur: 5 mg/dL — AB
Leukocytes,Ua: NEGATIVE
Nitrite: NEGATIVE
Protein, ur: 30 mg/dL — AB
Specific Gravity, Urine: 1.04 — ABNORMAL HIGH (ref 1.005–1.030)
pH: 6 (ref 5.0–8.0)

## 2018-09-22 LAB — LIPASE, BLOOD: Lipase: 19 U/L (ref 11–51)

## 2018-09-22 LAB — SARS CORONAVIRUS 2 (TAT 6-24 HRS): SARS Coronavirus 2: NEGATIVE

## 2018-09-22 MED ORDER — PIPERACILLIN-TAZOBACTAM 3.375 G IVPB
3.3750 g | Freq: Three times a day (TID) | INTRAVENOUS | Status: DC
  Administered 2018-09-22 – 2018-09-24 (×6): 3.375 g via INTRAVENOUS
  Filled 2018-09-22 (×6): qty 50

## 2018-09-22 MED ORDER — DIPHENHYDRAMINE HCL 50 MG/ML IJ SOLN: 12.5000 mg | Freq: Four times a day (QID) | INTRAMUSCULAR | Status: DC | PRN

## 2018-09-22 MED ORDER — POLYETHYLENE GLYCOL 3350 17 G PO PACK: 17.0000 g | PACK | Freq: Every day | ORAL | Status: DC | PRN

## 2018-09-22 MED ORDER — ACETAMINOPHEN 325 MG PO TABS: 650.0000 mg | ORAL_TABLET | Freq: Four times a day (QID) | ORAL | Status: DC | PRN

## 2018-09-22 MED ORDER — ONDANSETRON HCL 4 MG/2ML IJ SOLN: 4.0000 mg | Freq: Four times a day (QID) | INTRAMUSCULAR | Status: DC | PRN

## 2018-09-22 MED ORDER — PIPERACILLIN-TAZOBACTAM 4.5 G IVPB: 3.3750 g | Freq: Four times a day (QID) | INTRAVENOUS | Status: DC

## 2018-09-22 MED ORDER — SODIUM CHLORIDE 0.9 % IV SOLN
INTRAVENOUS | Status: DC
  Administered 2018-09-22 – 2018-09-23 (×2): via INTRAVENOUS

## 2018-09-22 MED ORDER — LEVOTHYROXINE SODIUM 25 MCG PO TABS
125.0000 ug | ORAL_TABLET | Freq: Every day | ORAL | Status: DC
  Administered 2018-09-22 – 2018-09-24 (×3): 125 ug via ORAL
  Filled 2018-09-22 (×3): qty 1

## 2018-09-22 MED ORDER — ACETAMINOPHEN 650 MG RE SUPP: 650.0000 mg | Freq: Four times a day (QID) | RECTAL | Status: DC | PRN

## 2018-09-22 MED ORDER — DIPHENHYDRAMINE HCL 12.5 MG/5ML PO ELIX: 12.5000 mg | ORAL_SOLUTION | Freq: Four times a day (QID) | ORAL | Status: DC | PRN

## 2018-09-22 MED ORDER — IOHEXOL 300 MG/ML  SOLN
100.0000 mL | Freq: Once | INTRAMUSCULAR | Status: AC | PRN
  Administered 2018-09-22: 100 mL via INTRAVENOUS

## 2018-09-22 MED ORDER — SIMETHICONE 80 MG PO CHEW: 40.0000 mg | CHEWABLE_TABLET | Freq: Four times a day (QID) | ORAL | Status: DC | PRN

## 2018-09-22 MED ORDER — METHOCARBAMOL 1000 MG/10ML IJ SOLN
500.0000 mg | Freq: Four times a day (QID) | INTRAVENOUS | Status: DC | PRN
  Filled 2018-09-22: qty 5

## 2018-09-22 MED ORDER — ENOXAPARIN SODIUM 40 MG/0.4ML ~~LOC~~ SOLN
40.0000 mg | SUBCUTANEOUS | Status: DC
  Administered 2018-09-22 – 2018-09-23 (×2): 40 mg via SUBCUTANEOUS
  Filled 2018-09-22 (×3): qty 0.4

## 2018-09-22 MED ORDER — HYDRALAZINE HCL 20 MG/ML IJ SOLN: 10.0000 mg | INTRAMUSCULAR | Status: DC | PRN

## 2018-09-22 MED ORDER — FENTANYL CITRATE (PF) 100 MCG/2ML IJ SOLN: 12.5000 ug | INTRAMUSCULAR | Status: DC | PRN

## 2018-09-22 MED ORDER — SODIUM CHLORIDE 0.9 % IV BOLUS
1000.0000 mL | Freq: Once | INTRAVENOUS | Status: AC
  Administered 2018-09-22: 1000 mL via INTRAVENOUS

## 2018-09-22 MED ORDER — TRAMADOL HCL 50 MG PO TABS: 50.0000 mg | ORAL_TABLET | Freq: Four times a day (QID) | ORAL | Status: DC | PRN

## 2018-09-22 MED ORDER — PANTOPRAZOLE SODIUM 40 MG IV SOLR
40.0000 mg | Freq: Every day | INTRAVENOUS | Status: DC
  Administered 2018-09-22 – 2018-09-23 (×2): 40 mg via INTRAVENOUS
  Filled 2018-09-22 (×2): qty 40

## 2018-09-22 MED ORDER — ONDANSETRON 4 MG PO TBDP: 4.0000 mg | ORAL_TABLET | Freq: Four times a day (QID) | ORAL | Status: DC | PRN

## 2018-09-22 MED ORDER — BISACODYL 10 MG RE SUPP: 10.0000 mg | Freq: Every day | RECTAL | Status: DC | PRN

## 2018-09-22 MED ORDER — POTASSIUM CHLORIDE CRYS ER 20 MEQ PO TBCR
40.0000 meq | EXTENDED_RELEASE_TABLET | Freq: Once | ORAL | Status: AC
  Administered 2018-09-22: 40 meq via ORAL
  Filled 2018-09-22: qty 2

## 2018-09-22 MED ORDER — PIPERACILLIN-TAZOBACTAM 3.375 G IVPB 30 MIN
3.3750 g | Freq: Once | INTRAVENOUS | Status: AC
  Administered 2018-09-22: 3.375 g via INTRAVENOUS
  Filled 2018-09-22: qty 50

## 2018-09-22 NOTE — ED Provider Notes (Signed)
[facility] EMERGENCY DEPARTMENT Provider Note   CSN: [id number] Arrival date & time: 09/21/18  2237     History   Chief Complaint Chief Complaint  Patient presents with  . Abdominal Pain    HPI Heather Allison is a 68 y.o. female. Patient complains of abdominal cramping and general discomfort that first occurred 3 days ago and has been intermittent since.  Patient states at its worst the pain is 10/10 and causes her to "double over" and pain but states this pain is not present currently and she feels "sore".  Patient states she has had diarrhea but denies nausea or vomiting patient describes her current pain as dull and similar to gas pain that she has had in the past however this has not improved with phazyme-an over-the-counter gas pill.  Patient states that her abdomen looks more bloated than usual.  Patient has had cholecystectomy and appendectomy.     HPI  Past Medical History:  Diagnosis Date  . Hypothyroidism   . PONV (postoperative nausea and vomiting)   . Varicose veins   . Wears glasses     Patient Active Problem List   Diagnosis Date Noted  . Spider veins 05/31/2013    Past Surgical History:  Procedure Laterality Date  . ABDOMINAL HYSTERECTOMY    . APPENDECTOMY  23009   lap appendix-rt adrenal mass  . CHOLECYSTECTOMY  1997  . COLONOSCOPY    . FOOT SURGERY  2013   right-plantar   . KNEE ARTHROSCOPY WITH MEDIAL MENISECTOMY Left 08/25/2013   Procedure: KNEE ARTHROSCOPY WITH DEBRIDEMENT/SHAVING (CHONDROPLASTY),WITH MENISCECTOMY MEDIAL;  Surgeon: Ninetta Lights, MD;  Location: [facility];  Service: Orthopedics;  Laterality: Left;  . ovaries removed  2009   rt overian cyst-lt ovary out  . RECTAL SURGERY     torn tissue     OB History   No obstetric history on file.      Home Medications    Prior to Admission medications   Medication Sig Start Date End Date Taking? Authorizing Provider  cholecalciferol (VITAMIN D)  1000 UNITS tablet Take 1,000 Units by mouth daily. 5000    [provider]  levothyroxine (SYNTHROID, LEVOTHROID) 125 MCG tablet Take 125 mcg by mouth daily before breakfast.    [provider]  Multiple Vitamins-Minerals (MULTIVITAMIN WITH MINERALS) tablet Take 1 tablet by mouth daily.    [provider]  ondansetron (ZOFRAN) 4 MG tablet Take 1 tablet (4 mg total) by mouth every 8 (eight) hours as needed for nausea or vomiting. 08/25/13   Aundra Dubin, PA-C  oxyCODONE-acetaminophen (ROXICET) 5-325 MG per tablet Take 1-2 tablets by mouth every 4 (four) hours as needed. 08/25/13   Aundra Dubin, PA-C    Family History History reviewed. No pertinent family history.  Social History Social History   Tobacco Use  . Smoking status: Former Smoker    Quit date: 01/14/2008    Years since quitting: 10.6  Substance Use Topics  . Alcohol use: No  . Drug use: No     Allergies   Hydrocodone, Latex, Morphine and related, Codeine, and Erythromycin   Review of Systems Review of Systems  Constitutional: Negative for chills and fever.  HENT: Negative for congestion, ear pain, sinus pressure and sinus pain.   Eyes: Negative for pain and visual disturbance.  Respiratory: Negative for cough and shortness of breath.   Cardiovascular: Negative for chest pain, palpitations and leg swelling.  Gastrointestinal: Positive for abdominal  distention, abdominal pain and diarrhea. Negative for blood in stool and constipation.  Genitourinary: Negative for dysuria and hematuria.  Musculoskeletal: Negative for arthralgias, back pain and myalgias.  Skin: Negative for color change and rash.  Neurological: Positive for headaches. Negative for dizziness and syncope.  All other systems reviewed and are negative.    Physical Exam Updated Vital Signs BP 128/63 (BP Location: Left Arm)   Pulse 87   Temp 98.3 F (36.8 C) (Oral)   Resp 16   SpO2 100%   Physical Exam Vitals signs  and nursing note reviewed.  Constitutional:      Appearance: She is not ill-appearing.  HENT:     Head: Normocephalic and atraumatic.  Eyes:     General: No scleral icterus. Neck:     Musculoskeletal: No neck rigidity.  Cardiovascular:     Rate and Rhythm: Normal rate and regular rhythm.     Pulses: Normal pulses.     Heart sounds: Normal heart sounds.  Pulmonary:     Effort: Pulmonary effort is normal.     Breath sounds: Normal breath sounds.  Abdominal:     General: Abdomen is flat. Bowel sounds are normal. There is no distension.     Palpations: Abdomen is soft.     Tenderness: There is no abdominal tenderness. There is no guarding or rebound.  Musculoskeletal:     Right lower leg: No edema.     Left lower leg: No edema.  Skin:    General: Skin is warm and dry.     Capillary Refill: Capillary refill takes less than 2 seconds.  Neurological:     Mental Status: She is alert. Mental status is at baseline.  Psychiatric:        Behavior: Behavior normal.      ED Treatments / Results  Labs (all labs ordered are listed, but only abnormal results are displayed) Labs Reviewed  COMPREHENSIVE METABOLIC PANEL  LIPASE, BLOOD  CBC WITH DIFFERENTIAL/PLATELET  URINALYSIS, ROUTINE W REFLEX MICROSCOPIC    EKG None  Radiology No results found.  Procedures Procedures (including critical care time)  Medications Ordered in ED Medications  sodium chloride 0.9 % bolus 1,000 mL (has no administration in time range)     Initial Impression / Assessment and Plan / ED Course  I have reviewed the triage vital signs and the nursing notes.  Pertinent labs & imaging results that were available during my care of the patient were reviewed by me and considered in my medical decision making (see chart for details).      Patient is a 68 year old female with several days of intermittent severe abdominal pain and was sent by PCP for CT scan of abdomen pelvis.  Patient has a history of  hypothyroidism but states she takes her medication as prescribed and is unlikely that diarrhea and abdominal discomfort is due to thyroid medication overdose or symptoms of hypothyroidism.  Physical exam shows generalized discomfort but no peritoneal signs or localized tenderness--this is not helpful in constructing a differential.  Patient was sent with lab work that shows a WBC 15.6 (13.2 during ED visit today).  Patient has mild hypokalemia at 3.1 patient given 40 mEq potassium p.o.    7:17 AM Handoff given to [employer].  - CT abdomen pelvis w/ contrast pending at time of handoff - Vital signs are within normal limits.    Final Clinical Impressions(s) / ED Diagnoses   Final diagnoses:  None    ED Discharge Orders  None       Pati Gallo [place], [place] [id number]    Delora Fuel, MD [id number]

## 2018-09-22 NOTE — H&P (Addendum)
[facility] Admission Note  Heather Allison 08/26/1950  [id number].    Requesting MD: Carmon Sails  Chief Complaint: Lower abdominal pain Reason for Consult: Diverticulitis with contained perforation   HPI: Heather Allison is a 68 y.o. female with a history of hypothyroidism who presented to [facility] for abdominal pain. Patient reports on Sunday that she began having diffuse lower abdominal pain that was slightly worse on the left side. Her pain was mild at onset, constant and has gradually worsened. Her symptoms are worsened with movement and palpation. She has experienced associated chills but denies fever. She has tried Tylenol at home for this with some relief. She denies history of similar pain in the past. She reports some loose stools but no overt diarrhea. Her last BM was yesterday evening, non-bloody and non-mealanous. In the ED she was afebrile, WBC 13.2 and her CT showed complicated sigmoid diverticulitis with contained perforation causing a 4 x 1.6 cm gas collection along the left pelvic sidewall. No free pneumoperitoneum. She denies a history of diverticulitis. She has never had a pain like this before. She did have a colonoscopy 2 years ago that she states was normal. She has a family history of Colon CA (her father). She is not on any blood thinners. She has had a previous abdominal hysterectomy, laparoscopic appendectomy, and laparoscopic cholecystectomy. General surgery was asked to see for admission.   ROS: Review of Systems  Constitutional: Positive for chills. Negative for fever.  Respiratory: Negative for cough and shortness of breath.   Cardiovascular: Negative for chest pain.  Gastrointestinal: Positive for abdominal pain and diarrhea. Negative for constipation, nausea and vomiting.  Genitourinary: Negative for dysuria.  All other systems reviewed and are negative. All systems reviewed and otherwise negative except for as above  History reviewed. No pertinent  family history.  Past Medical History:  Diagnosis Date  . Hypothyroidism   . PONV (postoperative nausea and vomiting)   . Varicose veins   . Wears glasses     Past Surgical History:  Procedure Laterality Date  . ABDOMINAL HYSTERECTOMY    . APPENDECTOMY  23009   lap appendix-rt adrenal mass  . CHOLECYSTECTOMY  1997  . COLONOSCOPY    . FOOT SURGERY  2013   right-plantar   . KNEE ARTHROSCOPY WITH MEDIAL MENISECTOMY Left 08/25/2013   Procedure: KNEE ARTHROSCOPY WITH DEBRIDEMENT/SHAVING (CHONDROPLASTY),WITH MENISCECTOMY MEDIAL;  Surgeon: Ninetta Lights, MD;  Location: [facility];  Service: Orthopedics;  Laterality: Left;  . ovaries removed  2009   rt overian cyst-lt ovary out  . RECTAL SURGERY     torn tissue    Social History:  reports that she quit smoking about 10 years ago. She does not have any smokeless tobacco history on file. She reports that she does not drink alcohol or use drugs.  Allergies:  Allergies  Allergen Reactions  . Hydrocodone Nausea And Vomiting  . Latex   . Morphine And Related   . Codeine Nausea And Vomiting  . Erythromycin Nausea And Vomiting    And rash    (Not in a hospital admission)   Prior to Admission medications   Medication Sig Start Date End Date Taking? Authorizing Provider  levothyroxine (SYNTHROID, LEVOTHROID) 125 MCG tablet Take 125 mcg by mouth daily before breakfast.   Yes [provider]  ondansetron (ZOFRAN) 4 MG tablet Take 1 tablet (4 mg total) by mouth every 8 (eight) hours as needed for nausea or vomiting. Patient not taking: Reported  on 09/22/2018 08/25/13   Aundra Dubin, PA-C  oxyCODONE-acetaminophen (ROXICET) 5-325 MG per tablet Take 1-2 tablets by mouth every 4 (four) hours as needed. Patient not taking: Reported on 09/22/2018 08/25/13   Aundra Dubin, PA-C    Blood pressure 137/64, pulse 82, temperature 98.3 F (36.8 C), temperature source Oral, resp. rate 16, SpO2 100 %. Physical  Exam: General: pleasant, WD/WN white female who is laying in bed in NAD HEENT: head is normocephalic, atraumatic.  Sclera are noninjected.  Pupils equal and round.  Ears and nose without any masses or lesions.  Mouth is pink and moist. Dentition fair Heart: regular, rate, and rhythm.  No obvious murmurs, gallops, or rubs noted.  Palpable pedal pulses bilaterally Lungs: CTAB, no wheezes, rhonchi, or rales noted.  Respiratory effort nonlabored Abd: Soft, ND, very mild tenderness of the lower abdomen, worse on the left without rebound, rigidity, guarding or peritonitis. +BS, no masses, hernias, or organomegaly. Prior waist line hysterectomy scar, along with multiple laparoscopic scars that are well healed.  MS: all 4 extremities are symmetrical with no cyanosis, clubbing, or edema. Skin: warm and dry with no masses, lesions, or rashes Psych: A&Ox3 with an appropriate affect. Neuro: cranial nerves grossly intact, extremity CSM intact bilaterally, normal speech  Results for orders placed or performed during the hospital encounter of 09/21/18 (from the past 48 hour(s))  Comprehensive metabolic panel     Status: Abnormal   Collection Time: 09/22/18  4:55 AM  Result Value Ref Range   Sodium 135 135 - 145 mmol/L   Potassium 3.1 (L) 3.5 - 5.1 mmol/L   Chloride 101 98 - 111 mmol/L   CO2 20 (L) 22 - 32 mmol/L   Glucose, Bld 107 (H) 70 - 99 mg/dL   BUN 9 8 - 23 mg/dL   Creatinine, Ser 0.76 0.44 - 1.00 mg/dL   Calcium 9.4 8.9 - 10.3 mg/dL   Total Protein 7.1 6.5 - 8.1 g/dL   Albumin 3.8 3.5 - 5.0 g/dL   AST 17 15 - 41 U/L   ALT 23 0 - 44 U/L   Alkaline Phosphatase 78 38 - 126 U/L   Total Bilirubin 1.3 (H) 0.3 - 1.2 mg/dL   GFR calc non Af Amer >60 >60 mL/min   GFR calc Af Amer >60 >60 mL/min   Anion gap 14 5 - 15    Comment: Performed at [facility], [address] [place], [place] [address]  Lipase, blood     Status: None   Collection Time: 09/22/18  4:55 AM  Result Value Ref Range    Lipase 19 11 - 51 U/L    Comment: Performed at [facility] [address], [place],  [address]  CBC with Diff     Status: Abnormal   Collection Time: 09/22/18  4:55 AM  Result Value Ref Range   WBC 13.2 (H) 4.0 - 10.5 K/uL   RBC 4.35 3.87 - 5.11 MIL/uL   Hemoglobin 12.9 12.0 - 15.0 g/dL   HCT 40.0 36.0 - 46.0 %   MCV 92.0 80.0 - 100.0 fL   MCH 29.7 26.0 - 34.0 pg   MCHC 32.3 30.0 - 36.0 g/dL   RDW 13.1 11.5 - 15.5 %   Platelets 223 150 - 400 K/uL   nRBC 0.0 0.0 - 0.2 %   Neutrophils Relative % 69 %   Neutro Abs 9.1 (H) 1.7 - 7.7 K/uL   Lymphocytes Relative 23 %  Lymphs Abs 3.0 0.7 - 4.0 K/uL   Monocytes Relative 7 %   Monocytes Absolute 1.0 0.1 - 1.0 K/uL   Eosinophils Relative 0 %   Eosinophils Absolute 0.0 0.0 - 0.5 K/uL   Basophils Relative 0 %   Basophils Absolute 0.0 0.0 - 0.1 K/uL   Immature Granulocytes 1 %   Abs Immature Granulocytes 0.06 0.00 - 0.07 K/uL    Comment: Performed at [facility], [place] [address], [place], [place] [address]  Urinalysis, Routine w reflex microscopic     Status: Abnormal   Collection Time: 09/22/18  7:29 AM  Result Value Ref Range   Color, Urine YELLOW YELLOW   APPearance CLEAR CLEAR   Specific Gravity, Urine 1.040 (H) 1.005 - 1.030   pH 6.0 5.0 - 8.0   Glucose, UA NEGATIVE NEGATIVE mg/dL   Hgb urine dipstick MODERATE (A) NEGATIVE   Bilirubin Urine NEGATIVE NEGATIVE   Ketones, ur 5 (A) NEGATIVE mg/dL   Protein, ur 30 (A) NEGATIVE mg/dL   Nitrite NEGATIVE NEGATIVE   Leukocytes,Ua NEGATIVE NEGATIVE   RBC / HPF 0-5 0 - 5 RBC/hpf   WBC, UA 0-5 0 - 5 WBC/hpf   Bacteria, UA FEW (A) NONE SEEN   Squamous Epithelial / LPF 0-5 0 - 5   Mucus PRESENT     Comment: Performed at [facility], [address] [place], [place] [address]   Ct Abdomen Pelvis W Contrast  Result Date: 09/22/2018 CLINICAL DATA:  Acute generalized abdominal pain. History of lower abdominal pain for 3 days EXAM: CT ABDOMEN AND PELVIS WITH  CONTRAST TECHNIQUE: Multidetector CT imaging of the abdomen and pelvis was performed using the standard protocol following bolus administration of intravenous contrast. CONTRAST:  167mL OMNIPAQUE IOHEXOL 300 MG/ML  SOLN COMPARISON:  07/25/2009 FINDINGS: Lower chest:  No contributory findings. Hepatobiliary: Possible hepatic steatosis.Cholecystectomy with normal common bile duct diameter. Pancreas: Unremarkable. Spleen: Unremarkable. Adrenals/Urinary Tract: Negative adrenals. No hydronephrosis or stone. Unremarkable bladder. Stomach/Bowel: Sigmoid diverticulosis with diverticulitis causing colonic wall thickening and fat edema with a gas channel extending from the tip of the offending diverticulum leftward along the pelvic sidewall where there is a gas collection measuring 4 x 1.6 cm on axial slices. No free pneumoperitoneum. No bowel obstruction. Appendectomy clips. Vascular/Lymphatic: Atherosclerosis. 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 This is separate from the gonadal vessels. Reproductive:Hysterectomy. Other: No ascites or pneumoperitoneum. Musculoskeletal: Disc degeneration greatest at L4-5 where there has been a right-sided laminotomy. IMPRESSION: Complicated sigmoid diverticulitis with contained perforation causing 4 x 1.6 cm gas collection along the left pelvic sidewall. Electronically Signed   By: Monte Fantasia M.D.   On: 09/22/2018 07:49   Anti-infectives (From admission, onward)   Start     Dose/Rate Route Frequency Ordered Stop   09/22/18 1500  piperacillin-tazobactam (ZOSYN) IVPB 3.375 g     3.375 g 12.5 mL/hr over 240 Minutes Intravenous Every 8 hours 09/22/18 0836     09/22/18 0845  piperacillin-tazobactam (ZOSYN) IVPB 3.375 g     3.375 g 100 mL/hr over 30 Minutes Intravenous  Once 09/22/18 0836 09/22/18 0952   09/22/18 0830  piperacillin-tazobactam (ZOSYN) IVPB 3.375 g  Status:   Discontinued     3.375 g 150 mL/hr over 30 Minutes Intravenous Every 6 hours 09/22/18 0829 09/22/18 0836      Assessment/Plan Hypothyroidism - Home Meds  Sigmoid Diverticulitis with contained perforation -  No indication for emergent surgery - IV abx - Bowel rest, ice chips - Hopefully will improve with conservative management. We discussed the possibility of surgery or a repeat CT scan if she does not improve.   ID - Zosyn  VTE - SCDs, Lovenox FEN - NPO except ice chips  Plan - Admit. NPO, bowel rest, IVF, pain control, antiemetics, antibiotics. Ambulate and IS.  Jillyn Ledger, [facility] Surgery 09/22/2018, 9:45 AM Pager: [phone]

## 2018-09-22 NOTE — Progress Notes (Signed)
Received Pt from ED via wheelchair. AOx4, VSS, ambulatory, no complaints of pain at this time. Oriented to room, call bell, phone and use of bed controls. Informed about visitation policy. Will continue to monitor.

## 2018-09-22 NOTE — ED Provider Notes (Signed)
0715: Handoff from previous ED PA at shift change.  Please see previous note for details.  Briefly 68 year old F here with abdominal pain for 3 days.  Associated with diarrhea and feeling bloated.  No urinary symptoms.  No fever.  Plan is to follow-up on CT A/P and urinalysis, reassess. Physical Exam  BP 137/64   Pulse 82   Temp 98.3 F (36.8 C) (Oral)   Resp 16   SpO2 100%   Physical Exam Constitutional:      Appearance: She is well-developed.  HENT:     Head: Normocephalic.     Nose: Nose normal.  Eyes:     General: Lids are normal.  Neck:     Musculoskeletal: Normal range of motion.  Cardiovascular:     Rate and Rhythm: Normal rate.  Pulmonary:     Effort: Pulmonary effort is normal. No respiratory distress.  Abdominal:     General: Abdomen is protuberant.     Palpations: Abdomen is soft.     Tenderness: There is abdominal tenderness in the left lower quadrant.     Comments: Mild "soreness" to LLQ. No G/R/R. No obvious distention. No suprapubic or CVA tenderness.  Musculoskeletal: Normal range of motion.  Neurological:     Mental Status: She is alert.  Psychiatric:        Behavior: Behavior normal.     ED Course/Procedures   Clinical Course as of Sep 21 925  Wed Sep 09, [id number]  0000000 Complicated sigmoid diverticulitis with contained perforation causing 4 x 1.6 cm gas collection along the left pelvic sidewall.   CT ABDOMEN PELVIS W CONTRAST [CG]    Clinical Course User Index [CG] Kinnie Feil, PA-C    Procedures  MDM   [phone]: ER work up reviewed remarkable for mild leukocytosis. UA with few bacteria but no WBC, leukocytes, nitrates. She has no UTI symptoms, will send for culture. CTAP reviewed by me and radiology shows sigmoid diverticulitis with perforation and gas.  No clinical decline on re-evaluation. Very mild discomfort. Updated pt and husband of results.  Spoke to general surgery Dr. Kieth Brightly who agrees with IV Zosyn.  General surgery will admit.   COVID swab ordered, asymptomatic.        Kinnie Feil, PA-C 09/22/18 [id number]    Charlesetta Shanks, MD 09/24/18 1158

## 2018-09-23 LAB — BASIC METABOLIC PANEL
Anion gap: 10 (ref 5–15)
BUN: 9 mg/dL (ref 8–23)
CO2: 20 mmol/L — ABNORMAL LOW (ref 22–32)
Calcium: 8.8 mg/dL — ABNORMAL LOW (ref 8.9–10.3)
Chloride: 108 mmol/L (ref 98–111)
Creatinine, Ser: 0.74 mg/dL (ref 0.44–1.00)
GFR calc Af Amer: >60 mL/min (ref >60)
GFR calc non Af Amer: >60 mL/min (ref >60)
Glucose, Bld: 74 mg/dL (ref 70–99)
Potassium: 3.6 mmol/L (ref 3.5–5.1)
Sodium: 138 mmol/L (ref 135–145)

## 2018-09-23 LAB — CBC
HCT: 34.6 % — ABNORMAL LOW (ref 36.0–46.0)
Hemoglobin: 11.3 g/dL — ABNORMAL LOW (ref 12.0–15.0)
MCH: 30.4 pg (ref 26.0–34.0)
MCHC: 32.7 g/dL (ref 30.0–36.0)
MCV: 93 fL (ref 80.0–100.0)
Platelets: 205 10*3/uL (ref 150–400)
RBC: 3.72 MIL/uL — ABNORMAL LOW (ref 3.87–5.11)
RDW: 13.1 % (ref 11.5–15.5)
WBC: 9.4 10*3/uL (ref 4.0–10.5)
nRBC: 0 % (ref 0.0–0.2)

## 2018-09-23 LAB — HIV ANTIBODY (ROUTINE TESTING W REFLEX): HIV Screen 4th Generation wRfx: NONREACTIVE

## 2018-09-23 MED ORDER — WHITE PETROLATUM EX OINT
TOPICAL_OINTMENT | CUTANEOUS | Status: AC
  Filled 2018-09-23: qty 28.35

## 2018-09-23 NOTE — Progress Notes (Signed)
  Progress Note: General Surgery Service   Assessment/Plan: Active Problems:   Sigmoid diverticulitis  s/p    Diverticulitis with localized extraluminal air -symptoms improved and leukocytosis decreasing -advance diet -continue IV abx today   LOS: 1 day  Chief Complaint/Subjective: Pain lessened, multiple watery bowel movements  Objective: Vital signs in last 24 hours: Temp:  [97.9 F (36.6 C)-98.7 F (37.1 C)] 98.2 F (36.8 C) (09/10 0516) Pulse Rate:  [72-91] 72 (09/10 0516) Resp:  [16-23] 23 (09/09 2059) BP: (108-138)/(53-69) 118/63 (09/10 0516) SpO2:  [96 %-100 %] 98 % (09/10 0516) Last BM Date: 09/22/18  Intake/Output from previous day: 09/09 0701 - 09/10 0700 In: 1299.6 [I.V.:299.6; IV Piggyback:1000] Out: -  Intake/Output this shift: No intake/output data recorded.  Lungs: nonlabored  Cardiovascular: RRR  Abd: soft, NT, ND  Extremities: no edema  Neuro: AOx4  Lab Results: CBC  Recent Labs    09/22/18 0455 09/23/18 0108  WBC 13.2* 9.4  HGB 12.9 11.3*  HCT 40.0 34.6*  PLT 223 205   BMET Recent Labs    09/22/18 0455 09/23/18 0108  NA 135 138  K 3.1* 3.6  CL 101 108  CO2 20* 20*  GLUCOSE 107* 74  BUN 9 9  CREATININE 0.76 0.74  CALCIUM 9.4 8.8*   PT/INR No results for input(s): LABPROT, INR in the last 72 hours. ABG No results for input(s): PHART, HCO3 in the last 72 hours.  Invalid input(s): PCO2, PO2  Studies/Results:  Anti-infectives: Anti-infectives (From admission, onward)   Start     Dose/Rate Route Frequency Ordered Stop   09/22/18 1500  piperacillin-tazobactam (ZOSYN) IVPB 3.375 g     3.375 g 12.5 mL/hr over 240 Minutes Intravenous Every 8 hours 09/22/18 0836     09/22/18 0845  piperacillin-tazobactam (ZOSYN) IVPB 3.375 g     3.375 g 100 mL/hr over 30 Minutes Intravenous  Once 09/22/18 0836 09/22/18 0952   09/22/18 0830  piperacillin-tazobactam (ZOSYN) IVPB 3.375 g  Status:  Discontinued     3.375 g 150 mL/hr over  30 Minutes Intravenous Every 6 hours 09/22/18 0829 09/22/18 0836      Medications: Scheduled Meds: . enoxaparin (LOVENOX) injection  40 mg Subcutaneous Q24H  . levothyroxine  125 mcg Oral QAC breakfast  . pantoprazole (PROTONIX) IV  40 mg Intravenous QHS   Continuous Infusions: . sodium chloride 100 mL/hr at 09/23/18 0123  . methocarbamol (ROBAXIN) IV    . piperacillin-tazobactam (ZOSYN)  IV 3.375 g (09/23/18 0655)   PRN Meds:.acetaminophen **OR** acetaminophen, bisacodyl, diphenhydrAMINE **OR** diphenhydrAMINE, fentaNYL (SUBLIMAZE) injection, hydrALAZINE, methocarbamol (ROBAXIN) IV, ondansetron **OR** ondansetron (ZOFRAN) IV, polyethylene glycol, simethicone, traMADol  Mickeal Skinner, MD [facility]

## 2018-09-23 NOTE — Plan of Care (Signed)
  Problem: Education: Goal: Knowledge of General Education information will improve Description Including pain rating scale, medication(s)/side effects and non-pharmacologic comfort measures Outcome: Progressing   

## 2018-09-24 MED ORDER — AMOXICILLIN-POT CLAVULANATE 875-125 MG PO TABS: 1.0000 | ORAL_TABLET | Freq: Two times a day (BID) | ORAL | 0 refills | Status: DC

## 2018-09-24 MED ORDER — SACCHAROMYCES BOULARDII 250 MG PO CAPS
250.0000 mg | ORAL_CAPSULE | Freq: Two times a day (BID) | ORAL | Status: DC
  Administered 2018-09-24: 11:00:00 250 mg via ORAL
  Filled 2018-09-24: qty 1

## 2018-09-24 MED ORDER — AMOXICILLIN-POT CLAVULANATE 875-125 MG PO TABS
1.0000 | ORAL_TABLET | Freq: Two times a day (BID) | ORAL | Status: DC
  Administered 2018-09-24: 1 via ORAL
  Filled 2018-09-24: qty 1

## 2018-09-24 MED ORDER — ACETAMINOPHEN 325 MG PO TABS: 650.0000 mg | ORAL_TABLET | Freq: Four times a day (QID) | ORAL | Status: AC | PRN

## 2018-09-24 MED ORDER — TRAMADOL HCL 50 MG PO TABS: 50.0000 mg | ORAL_TABLET | Freq: Four times a day (QID) | ORAL | 0 refills | Status: DC | PRN

## 2018-09-24 MED ORDER — SACCHAROMYCES BOULARDII 250 MG PO CAPS: ORAL_CAPSULE | ORAL | Status: DC

## 2018-09-24 NOTE — Progress Notes (Addendum)
    CC: Abdominal pain  Subjective: Patient has some gas pains but is significantly better.  She is no longer tender to palpation.  She is tolerating full liquids.  She has had several bowel movements.  She is feeling much better.  Objective: Vital signs in last 24 hours: Temp:  [97.4 F (36.3 C)-98.6 F (37 C)] 97.4 F (36.3 C) (09/11 0521) Pulse Rate:  [69-81] 69 (09/11 0521) Resp:  [18-19] 18 (09/11 0521) BP: (119-139)/(62-71) 128/62 (09/11 0521) SpO2:  [98 %-100 %] 98 % (09/11 0521) Weight:  [98.2 kg] 98.2 kg (09/10 2030) Last BM Date: 09/23/18 360 PO 760 IV Voided x 2 recorded Stool x 2 Afebrile VSS  Intake/Output from previous day: 09/10 0701 - 09/11 0700 In: 760 [P.O.:360; I.V.:400] Out: -  Intake/Output this shift: No intake/output data recorded.  General appearance: alert, cooperative and no distress Resp: clear to auscultation bilaterally GI: Soft she is no longer tender.  Initial discomfort was below the umbilicus she has none on palpation currently.  Positive BMs.  Lab Results:  Recent Labs    09/22/18 0455 09/23/18 0108  WBC 13.2* 9.4  HGB 12.9 11.3*  HCT 40.0 34.6*  PLT 223 205    BMET Recent Labs    09/22/18 0455 09/23/18 0108  NA 135 138  K 3.1* 3.6  CL 101 108  CO2 20* 20*  GLUCOSE 107* 74  BUN 9 9  CREATININE 0.76 0.74  CALCIUM 9.4 8.8*   PT/INR No results for input(s): LABPROT, INR in the last 72 hours.  Recent Labs  Lab 09/22/18 0455  AST 17  ALT 23  ALKPHOS 78  BILITOT 1.3*  PROT 7.1  ALBUMIN 3.8     Lipase     Component Value Date/Time   LIPASE 19 09/22/2018 0455     Medications: . enoxaparin (LOVENOX) injection  40 mg Subcutaneous Q24H  . levothyroxine  125 mcg Oral QAC breakfast  . pantoprazole (PROTONIX) IV  40 mg Intravenous QHS  . white petrolatum        Assessment/Plan Hypothyroid COVID negative 09/22/18  Sigmoid diverticulitis with contained perforation  - Complicated sigmoid diverticulitis  with contained perforation      causing 4 x 1.6 cm gas collection along the left pelvic sidewall  FEN: IV fluids/Full liquids ID:  Zosyn 9/9 >> day 3 DVT:  Lovenox Follow up:  [place]: Advance her to a soft diet.  Place her on oral antibiotics will convert her over to Augmentin this a.m.  Saline lock IV.  Mobilize more.  If she does well home in the PM on oral antibiotics.  She will need 10 days of antibiotics and follow up with Dr. Kieth Brightly in the office without CT.  LOS: 2 days    Heather Allison 09/24/2018 [phone]

## 2018-09-24 NOTE — Discharge Instructions (Signed)
Diverticulitis Call if you have recurrent fever, increased abdomina pain, trouble voiding, or just start feeling bad and not sure why.  Diverticulitis is when small pockets in your large intestine (colon) get infected or swollen. This causes stomach pain and watery poop (diarrhea). These pouches are called diverticula. They form in people who have a condition called diverticulosis. Follow these instructions at home: Medicines  Take over-the-counter and prescription medicines only as told by your doctor. These include: ? Antibiotics. ? Pain medicines. ? Fiber pills. ? Probiotics. ? Stool softeners.  Do not drive or use heavy machinery while taking prescription pain medicine.  If you were prescribed an antibiotic, take it as told. Do not stop taking it even if you feel better. General instructions   Follow a diet as told by your doctor.  When you feel better, your doctor may tell you to change your diet. You may need to eat a lot of fiber. Fiber makes it easier to poop (have bowel movements). Healthy foods with fiber include: ? Berries. ? Beans. ? Lentils. ? Green vegetables.  Exercise 3 or more times a week. Aim for 30 minutes each time. Exercise enough to sweat and make your heart beat faster.  Keep all follow-up visits as told. This is important. You may need to have an exam of the large intestine. This is called a colonoscopy. Contact a doctor if:  Your pain does not get better.  You have a hard time eating or drinking.  You are not pooping like normal. Get help right away if:  Your pain gets worse.  Your problems do not get better.  Your problems get worse very fast.  You have a fever.  You throw up (vomit) more than one time.  You have poop that is: ? Bloody. ? Black. ? Tarry. Summary  Diverticulitis is when small pockets in your large intestine (colon) get infected or swollen.  Take medicines only as told by your doctor.  Follow a diet as told by your  doctor. This information is not intended to replace advice given to you by your health care provider. Make sure you discuss any questions you have with your health care provider. Document Released: 06/18/2007 Document Revised: 12/12/2016 Document Reviewed: 01/17/2016 Elsevier Patient Education  2020 [place] for the next week A soft-food eating plan includes foods that are safe and easy to chew and swallow. Your health care provider or dietitian can help you find foods and flavors that fit into this plan. Follow this plan until your health care provider or dietitian says it is safe to start eating other foods and food textures. What are tips for following this plan? General guidelines   Take small bites of food, or cut food into pieces about  inch or smaller. Bite-sized pieces of food are easier to chew and swallow.  Eat moist foods. Avoid overly dry foods.  Avoid foods that: ? Are difficult to swallow, such as dry, chunky, crispy, or sticky foods. ? Are difficult to chew, such as hard, tough, or stringy foods. ? Contain nuts, seeds, or fruits.  Follow instructions from your dietitian about the types of liquids that are safe for you to swallow. You may be allowed to have: ? Thick liquids only. This includes only liquids that are thicker than honey. ? Thin and thick liquids. This includes all beverages and foods that become liquid at room temperature.  To make thick liquids: ? Purchase a commercial liquid thickening  powder. These are available at grocery stores and pharmacies. ? Mix the thickener into liquids according to instructions on the label. ? Purchase ready-made thickened liquids. ? Thicken soup by pureeing, straining to remove chunks, and adding flour, potato flakes, or corn starch. ? Add commercial thickener to foods that become liquid at room temperature, such as milk shakes, yogurt, ice cream, gelatin, and sherbet.  Ask your health care  provider whether you need to take a fiber supplement. Cooking  Cook meats so they stay tender and moist. Use methods like braising, stewing, or baking in liquid.  Cook vegetables and fruit until they are soft enough to be mashed with a fork.  Peel soft, fresh fruits such as peaches, nectarines, and melons.  When making soup, make sure chunks of meat and vegetables are smaller than  inch.  Reheat leftover foods slowly so that a tough crust does not form. What foods are allowed? The items listed below may not be a complete list. Talk with your dietitian about what dietary choices are best for you. Grains Breads, muffins, pancakes, or waffles moistened with syrup, jelly, or butter. Dry cereals well-moistened with milk. Moist, cooked cereals. Well-cooked pasta and rice. Vegetables All soft-cooked vegetables. Shredded lettuce. Fruits All canned and cooked fruits. Soft, peeled fresh fruits. Strawberries. Dairy Milk. Cream. Yogurt. Cottage cheese. Soft cheese without the rind. Meats and other protein foods Tender, moist ground meat, poultry, or fish. Meat cooked in gravy or sauces. Eggs. Sweets and desserts Ice cream. Milk shakes. Sherbet. Pudding. Fats and oils Butter. Margarine. Olive, canola, sunflower, and grapeseed oil. Smooth salad dressing. Smooth cream cheese. Mayonnaise. Gravy. What foods are not allowed? The items listed bemay not be a complete list. Talk with your dietitian about what dietary choices are best for you. Grains Coarse or dry cereals, such as bran, granola, and shredded wheat. Tough or chewy crusty breads, such as [place] bread or baguettes. Breads with nuts, seeds, or fruit. Vegetables All raw vegetables. Cooked corn. Cooked vegetables that are tough or stringy. Tough, crisp, fried potatoes and potato skins. Fruits Fresh fruits with skins or seeds, or both, such as apples, pears, and grapes. Stringy, high-pulp fruits, such as papaya, pineapple, coconut, and mango.  Fruit leather and all dried fruit. Dairy Yogurt with nuts or coconut. Meats and other protein foods Hard, dry sausages. Dry meat, poultry, or fish. Meats with gristle. Fish with bones. Fried meat or fish. Lunch meat and hotdogs. Nuts and seeds. Chunky peanut butter or other nut butters. Sweets and desserts Cakes or cookies that are very dry or chewy. Desserts with dried fruit, nuts, or coconut. Fried pastries. Very rich pastries. Fats and oils Cream cheese with fruit or nuts. Salad dressings with seeds or chunks. Summary  A soft-food eating plan includes foods that are safe and easy to swallow. Generally, the foods should be soft enough to be mashed with a fork.  Avoid foods that are dry, hard to chew, crunchy, sticky, stringy, or crispy.  Ask your health care provider whether you need to thicken your liquids and if you need to take a fiber supplement. This information is not intended to replace advice given to you by your health care provider. Make sure you discuss any questions you have with your health care provider. Document Released: 04/08/2007 Document Revised: 04/22/2018 Document Reviewed: 03/04/2016 Elsevier Patient Education  [place].   High-Fiber Diet Fiber, also called dietary fiber, is a type of carbohydrate that is found in fruits, vegetables, whole grains,  and beans. A high-fiber diet can have many health benefits. Your health care provider may recommend a high-fiber diet to help:  Prevent constipation. Fiber can make your bowel movements more regular.  Lower your cholesterol.  Relieve the following conditions: ? Swelling of veins in the anus (hemorrhoids). ? Swelling and irritation (inflammation) of specific areas of the digestive tract (uncomplicated diverticulosis). ? A problem of the large intestine (colon) that sometimes causes pain and diarrhea (irritable bowel syndrome, IBS).  Prevent overeating as part of a weight-loss plan.  Prevent heart disease,  type 2 diabetes, and certain cancers. What is my plan? The recommended daily fiber intake in grams (g) includes:  38 g for men age 42 or younger.  30 g for men over age 58.  32 g for women age 62 or younger.  21 g for women over age 48. You can get the recommended daily intake of dietary fiber by:  Eating a variety of fruits, vegetables, grains, and beans.  Taking a fiber supplement, if it is not possible to get enough fiber through your diet. What do I need to know about a high-fiber diet?  It is better to get fiber through food sources rather than from fiber supplements. There is not a lot of research about how effective supplements are.  Always check the fiber content on the nutrition facts label of any prepackaged food. Look for foods that contain 5 g of fiber or more per serving.  Talk with a diet and nutrition specialist (dietitian) if you have questions about specific foods that are recommended or not recommended for your medical condition, especially if those foods are not listed below.  Gradually increase how much fiber you consume. If you increase your intake of dietary fiber too quickly, you may have bloating, cramping, or gas.  Drink plenty of water. Water helps you to digest fiber. What are tips for following this plan?  Eat a wide variety of high-fiber foods.  Make sure that half of the grains that you eat each day are whole grains.  Eat breads and cereals that are made with whole-grain flour instead of refined flour or white flour.  Eat brown rice, bulgur wheat, or millet instead of white rice.  Start the day with a breakfast that is high in fiber, such as a cereal that contains 5 g of fiber or more per serving.  Use beans in place of meat in soups, salads, and pasta dishes.  Eat high-fiber snacks, such as berries, raw vegetables, nuts, and popcorn.  Choose whole fruits and vegetables instead of processed forms like juice or sauce. What foods can I  eat?  Fruits Berries. Pears. Apples. Oranges. Avocado. Prunes and raisins. Dried figs. Vegetables Sweet potatoes. Spinach. Kale. Artichokes. Cabbage. Broccoli. Cauliflower. Green peas. Carrots. Squash. Grains Whole-grain breads. Multigrain cereal. Oats and oatmeal. Brown rice. Barley. Bulgur wheat. [place]. Quinoa. Bran muffins. Popcorn. Rye wafer crackers. Meats and other proteins [place], kidney, and pinto beans. Soybeans. Split peas. Lentils. Nuts and seeds. Dairy Fiber-fortified yogurt. Beverages Fiber-fortified soy milk. Fiber-fortified orange juice. Other foods Fiber bars. The items listed above may not be a complete list of recommended foods and beverages. Contact a dietitian for more options. What foods are not recommended? Fruits Fruit juice. Cooked, strained fruit. Vegetables Fried potatoes. Canned vegetables. Well-cooked vegetables. Grains White bread. Pasta made with refined flour. White rice. Meats and other proteins Fatty cuts of meat. Fried chicken or fried fish. Dairy Milk. Yogurt. Cream cheese. Sour cream. Fats and  oils Butters. Beverages Soft drinks. Other foods Cakes and pastries. The items listed above may not be a complete list of foods and beverages to avoid. Contact a dietitian for more information. Summary  Fiber is a type of carbohydrate. It is found in fruits, vegetables, whole grains, and beans.  There are many health benefits of eating a high-fiber diet, such as preventing constipation, lowering blood cholesterol, helping with weight loss, and reducing your risk of heart disease, diabetes, and certain cancers.  Gradually increase your intake of fiber. Increasing too fast can result in cramping, bloating, and gas. Drink plenty of water while you increase your fiber.  The best sources of fiber include whole fruits and vegetables, whole grains, nuts, seeds, and beans. This information is not intended to replace advice given to you by your health care  provider. Make sure you discuss any questions you have with your health care provider. Document Released: 12/30/2004 Document Revised: 11/03/2016 Document Reviewed: 11/03/2016 Elsevier Patient Education  2020 [employer].

## 2018-09-24 NOTE — Discharge Summary (Signed)
Physician Discharge Summary  Patient ID: Heather Allison MRN: [id number] DOB/AGE: 66-Jan-1952 66 y.o.  Admit date: 09/21/2018 Discharge date: 09/24/2018  Admission Diagnoses:  Sigmoid diverticulitis with contained perforation Hypothyroid  Discharge Diagnoses:  Sigmoid diverticulitis with contained perforation Hypothyroid COVID negative  Active Problems:   Sigmoid diverticulitis   PROCEDURES: None  Hospital Course:   Heather Allison is a 68 y.o. female with a history of hypothyroidism who presented to [facility] for abdominal pain. Patient reports on Sunday that she began having diffuse lower abdominal pain that was slightly worse on the left side. Her pain was mild at onset, constant and has gradually worsened. Her symptoms are worsened with movement and palpation. She has experienced associated chills but denies fever. She has tried Tylenol at home for this with some relief. She denies history of similar pain in the past. She reports some loose stools but no overt diarrhea. Her last BM was yesterday evening, non-bloody and non-mealanous. In the ED she was afebrile, WBC 13.2 and her CT showed complicated sigmoid diverticulitis with contained perforation causing a 4 x 1.6 cm gas collection along the left pelvic sidewall. No free pneumoperitoneum. She denies a history of diverticulitis. She has never had a pain like this before. She did have a colonoscopy 2 years ago that she states was normal. She has a family history of Colon CA (her father). She is not on any blood thinners. She has had a previous abdominal hysterectomy, laparoscopic appendectomy, and laparoscopic cholecystectomy. General surgery was asked to see for admission.   Patient was admitted made n.p.o. placed on bowel rest, IV fluids and IV antibiotics.  She made good progress, and by 09/24/2018 she was advanced to p.o. Augmentin, and a soft diet.  She tolerated both well.  Her pain had resolved and she was ready for discharge.  She will go  home on total of 10 days of antibiotics.  We recommended she continue a probiotic at home.  Scheduled for follow-up as noted below.  We reviewed the possibility at that her symptoms could reoccur and gave her instructions call if she has any problems prior to her follow-up appointment.  CBC Latest Ref Rng & Units 09/23/2018 09/22/2018 08/25/2013  WBC 4.0 - 10.5 K/uL 9.4 13.2(H) -  Hemoglobin 12.0 - 15.0 g/dL 11.3(L) 12.9 14.0  Hematocrit 36.0 - 46.0 % 34.6(L) 40.0 -  Platelets 150 - 400 K/uL 205 223 -   CMP Latest Ref Rng & Units 09/23/2018 09/22/2018 07/01/2007  Glucose 70 - 99 mg/dL 74 107(H) 97  BUN 8 - 23 mg/dL 9 9 10   Creatinine 0.44 - 1.00 mg/dL 0.74 0.76 0.70  Sodium 135 - 145 mmol/L 138 135 140  Potassium 3.5 - 5.1 mmol/L 3.6 3.1(L) 4.3  Chloride 98 - 111 mmol/L 108 101 108  CO2 22 - 32 mmol/L 20(L) 20(L) 25  Calcium 8.9 - 10.3 mg/dL 8.8(L) 9.4 9.8  Total Protein 6.5 - 8.1 g/dL - 7.1 6.7  Total Bilirubin 0.3 - 1.2 mg/dL - 1.3(H) 0.6  Alkaline Phos 38 - 126 U/L - 78 87  AST 15 - 41 U/L - 17 34  ALT 0 - 44 U/L - 23 46(H)   CT scan 09/22/2018: Lower chest:  No contributory findings. Hepatobiliary: Possible hepatic steatosis.Cholecystectomy with normal common bile duct diameter. Pancreas: Unremarkable. Spleen: Unremarkable. Adrenals/Urinary Tract: Negative adrenals. No hydronephrosis or stone. Unremarkable bladder. Stomach/Bowel: Sigmoid diverticulosis with diverticulitis causing colonic wall thickening and fat edema with a gas channel extending from the  tip of the offending diverticulum leftward along the pelvic sidewall where there is a gas collection measuring 4 x 1.6 cm on axial slices. No free pneumoperitoneum. No bowel obstruction. Appendectomy clips. Vascular/Lymphatic: Atherosclerosis. 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 This is separate from the gonadal  vessels. Reproductive:Hysterectomy. Other: No ascites or pneumoperitoneum. Musculoskeletal: Disc degeneration greatest at L4-5 where there has been a right-sided laminotomy.  IMPRESSION: Complicated sigmoid diverticulitis with contained perforation causing 4 x 1.6 cm gas collection along the left pelvic sidewall.    Disposition: Discharge disposition: 01-Home or Self Care        Allergies as of 09/24/2018      Reactions   Hydrocodone Nausea And Vomiting   Latex    Morphine And Related    Codeine Nausea And Vomiting   Erythromycin Nausea And Vomiting   And rash      Medication List    STOP taking these medications   ondansetron 4 MG tablet Commonly known as: Zofran   oxyCODONE-acetaminophen 5-325 MG tablet Commonly known as: Roxicet     TAKE these medications   acetaminophen 325 MG tablet Commonly known as: TYLENOL Take 2 tablets (650 mg total) by mouth every 6 (six) hours as needed for mild pain (or temp > 100).   amoxicillin-clavulanate 875-125 MG tablet Commonly known as: AUGMENTIN Take 1 tablet by mouth every 12 (twelve) hours.   levothyroxine 125 MCG tablet Commonly known as: SYNTHROID Take 125 mcg by mouth daily before breakfast.   saccharomyces boulardii 250 MG capsule Commonly known as: FLORASTOR This is the probiotic, you can buy this over the counter at any drug store.  Use for the next 2-3 weeks and follow package instructions on dose.   traMADol 50 MG tablet Commonly known as: ULTRAM Take 1 tablet (50 mg total) by mouth every 6 (six) hours as needed for moderate pain or severe pain.      Follow-up Information    Kinsinger, Arta Bruce, MD Follow up on 10/13/2018.   Specialty: General Surgery Why: Your appointment is at 11:00 AM.  Be at the office 30 minutes early for check in.  Bring photo ID and insurance information. Contact information: [address] [place] [address] [phone]        Kelton Pillar, MD Follow  up.   Specialty: Family Medicine Why: Call and let her know about your diverticulitis and that you are being treated.  She can follow up on your other medical issues also.   Contact information: [address] [place] [address] [phone]           Signed: Earnstine Regal 09/24/2018, 10:39 AM

## 2018-10-13 DIAGNOSIS — K5792 Diverticulitis of intestine, part unspecified, without perforation or abscess without bleeding: Secondary | ICD-10-CM | POA: Diagnosis not present

## 2018-11-08 DIAGNOSIS — R69 Illness, unspecified: Secondary | ICD-10-CM | POA: Diagnosis not present

## 2018-12-28 DIAGNOSIS — G720 Drug-induced myopathy: Secondary | ICD-10-CM | POA: Diagnosis not present

## 2018-12-28 DIAGNOSIS — J309 Allergic rhinitis, unspecified: Secondary | ICD-10-CM | POA: Diagnosis not present

## 2018-12-28 DIAGNOSIS — E039 Hypothyroidism, unspecified: Secondary | ICD-10-CM | POA: Diagnosis not present

## 2018-12-28 DIAGNOSIS — R69 Illness, unspecified: Secondary | ICD-10-CM | POA: Diagnosis not present

## 2018-12-28 DIAGNOSIS — M5136 Other intervertebral disc degeneration, lumbar region: Secondary | ICD-10-CM | POA: Diagnosis not present

## 2018-12-28 DIAGNOSIS — Z800 Family history of malignant neoplasm of digestive organs: Secondary | ICD-10-CM | POA: Diagnosis not present

## 2018-12-28 DIAGNOSIS — Z23 Encounter for immunization: Secondary | ICD-10-CM | POA: Diagnosis not present

## 2018-12-28 DIAGNOSIS — Z0000 Encounter for general adult medical examination without abnormal findings: Secondary | ICD-10-CM | POA: Diagnosis not present

## 2018-12-28 DIAGNOSIS — E782 Mixed hyperlipidemia: Secondary | ICD-10-CM | POA: Diagnosis not present

## 2018-12-28 DIAGNOSIS — K5730 Diverticulosis of large intestine without perforation or abscess without bleeding: Secondary | ICD-10-CM | POA: Diagnosis not present

## 2019-01-04 DIAGNOSIS — Z1231 Encounter for screening mammogram for malignant neoplasm of breast: Secondary | ICD-10-CM | POA: Diagnosis not present

## 2019-01-05 DIAGNOSIS — D485 Neoplasm of uncertain behavior of skin: Secondary | ICD-10-CM | POA: Diagnosis not present

## 2019-01-05 DIAGNOSIS — L821 Other seborrheic keratosis: Secondary | ICD-10-CM | POA: Diagnosis not present

## 2019-01-05 DIAGNOSIS — Z85828 Personal history of other malignant neoplasm of skin: Secondary | ICD-10-CM | POA: Diagnosis not present

## 2019-01-05 DIAGNOSIS — L430 Hypertrophic lichen planus: Secondary | ICD-10-CM | POA: Diagnosis not present

## 2019-01-18 DIAGNOSIS — R69 Illness, unspecified: Secondary | ICD-10-CM | POA: Diagnosis not present

## 2019-02-07 DIAGNOSIS — L570 Actinic keratosis: Secondary | ICD-10-CM | POA: Diagnosis not present

## 2019-02-07 DIAGNOSIS — D692 Other nonthrombocytopenic purpura: Secondary | ICD-10-CM | POA: Diagnosis not present

## 2019-02-07 DIAGNOSIS — Z85828 Personal history of other malignant neoplasm of skin: Secondary | ICD-10-CM | POA: Diagnosis not present

## 2019-02-07 DIAGNOSIS — L298 Other pruritus: Secondary | ICD-10-CM | POA: Diagnosis not present

## 2019-02-07 DIAGNOSIS — L820 Inflamed seborrheic keratosis: Secondary | ICD-10-CM | POA: Diagnosis not present

## 2019-04-11 DIAGNOSIS — Z0100 Encounter for examination of eyes and vision without abnormal findings: Secondary | ICD-10-CM | POA: Diagnosis not present

## 2019-06-22 DIAGNOSIS — R69 Illness, unspecified: Secondary | ICD-10-CM | POA: Diagnosis not present

## 2019-06-28 ENCOUNTER — Other Ambulatory Visit: Payer: Self-pay | Admitting: Family Medicine

## 2019-06-28 ENCOUNTER — Ambulatory Visit
Admission: RE | Admit: 2019-06-28 | Discharge: 2019-06-28 | Disposition: A | Discharge status: Home / Self Care | Payer: Medicare HMO | Source: Ambulatory Visit | Attending: Family Medicine | Admitting: Family Medicine

## 2019-06-28 DIAGNOSIS — E039 Hypothyroidism, unspecified: Secondary | ICD-10-CM | POA: Diagnosis not present

## 2019-06-28 DIAGNOSIS — J0100 Acute maxillary sinusitis, unspecified: Secondary | ICD-10-CM | POA: Diagnosis not present

## 2019-06-28 DIAGNOSIS — Z03818 Encounter for observation for suspected exposure to other biological agents ruled out: Secondary | ICD-10-CM | POA: Diagnosis not present

## 2019-06-28 DIAGNOSIS — R05 Cough: Secondary | ICD-10-CM | POA: Diagnosis not present

## 2019-06-28 DIAGNOSIS — E782 Mixed hyperlipidemia: Secondary | ICD-10-CM | POA: Diagnosis not present

## 2019-06-28 DIAGNOSIS — J069 Acute upper respiratory infection, unspecified: Secondary | ICD-10-CM

## 2019-07-04 DIAGNOSIS — L570 Actinic keratosis: Secondary | ICD-10-CM | POA: Diagnosis not present

## 2019-07-04 DIAGNOSIS — L821 Other seborrheic keratosis: Secondary | ICD-10-CM | POA: Diagnosis not present

## 2019-07-04 DIAGNOSIS — L820 Inflamed seborrheic keratosis: Secondary | ICD-10-CM | POA: Diagnosis not present

## 2019-07-04 DIAGNOSIS — L918 Other hypertrophic disorders of the skin: Secondary | ICD-10-CM | POA: Diagnosis not present

## 2019-07-04 DIAGNOSIS — Z85828 Personal history of other malignant neoplasm of skin: Secondary | ICD-10-CM | POA: Diagnosis not present

## 2019-08-12 DIAGNOSIS — L089 Local infection of the skin and subcutaneous tissue, unspecified: Secondary | ICD-10-CM | POA: Diagnosis not present

## 2019-09-14 ENCOUNTER — Ambulatory Visit (INDEPENDENT_AMBULATORY_CARE_PROVIDER_SITE_OTHER): Payer: Medicare HMO

## 2019-09-14 ENCOUNTER — Ambulatory Visit: Payer: Medicare HMO | Admitting: Podiatry

## 2019-09-14 ENCOUNTER — Other Ambulatory Visit: Payer: Self-pay

## 2019-09-14 ENCOUNTER — Ambulatory Visit: Payer: Medicare HMO

## 2019-09-14 ENCOUNTER — Other Ambulatory Visit: Payer: Self-pay | Admitting: Podiatry

## 2019-09-14 ENCOUNTER — Encounter: Payer: Self-pay | Admitting: Podiatry

## 2019-09-14 VITALS — BP 141/80 | HR 67 | Temp 97.1°F | Resp 16

## 2019-09-14 DIAGNOSIS — K589 Irritable bowel syndrome without diarrhea: Secondary | ICD-10-CM | POA: Diagnosis not present

## 2019-09-14 DIAGNOSIS — D229 Melanocytic nevi, unspecified: Secondary | ICD-10-CM | POA: Diagnosis not present

## 2019-09-14 DIAGNOSIS — M779 Enthesopathy, unspecified: Secondary | ICD-10-CM

## 2019-09-14 DIAGNOSIS — M778 Other enthesopathies, not elsewhere classified: Secondary | ICD-10-CM

## 2019-09-14 DIAGNOSIS — M79671 Pain in right foot: Secondary | ICD-10-CM | POA: Diagnosis not present

## 2019-09-14 NOTE — Patient Instructions (Signed)

## 2019-09-14 NOTE — Progress Notes (Signed)
Subjective:   Patient ID: Heather Allison, female   DOB: 69 y.o.   MRN: [id number]   HPI Patient presents stating she is got a lot of pain in her right ankle and into her right foot and its been on and off but more consistent recently and has been going on for 3 to 4 months.  Patient states that it sore and that icing has helped and it seems to come in waves.  Has not been able to identify a pattern and patient does not smoke currently and likes to be active   Review of Systems  All other systems reviewed and are negative.       Objective:  Physical Exam Vitals and nursing note reviewed.  Constitutional:      Appearance: She is well-developed.  Pulmonary:     Effort: Pulmonary effort is normal.  Musculoskeletal:        General: Normal range of motion.  Skin:    General: Skin is warm.  Neurological:     Mental Status: She is alert.     Neurovascular status intact muscle strength found to be adequate range of motion within normal limits.  Patient is found to have exquisite discomfort in the right sinus tarsi with inflammation fluid buildup with mild discomfort into the peroneal tendon group base of fifth metatarsal.  Patient is found to have good digital perfusion well oriented x3     Assessment:  Acute sinus tarsitis right with inflammation fluid of the joint     Plan:  H&P x-ray reviewed condition discussed.  At this point I did sterile prep and injected the sinus tarsi right 3 mg Kenalog 5 mg Xylocaine and applied fascial brace to limit motion and support the arch.  Discussed other treatments that might be possible in future but at this point working to just watch it and see how it does  X-rays indicate there does not appear to be a fracture of the lateral foot and I did not note arthritis subtalar or ankle joint

## 2019-11-06 DIAGNOSIS — R69 Illness, unspecified: Secondary | ICD-10-CM | POA: Diagnosis not present

## 2019-12-29 DIAGNOSIS — Z0000 Encounter for general adult medical examination without abnormal findings: Secondary | ICD-10-CM | POA: Diagnosis not present

## 2019-12-29 DIAGNOSIS — R69 Illness, unspecified: Secondary | ICD-10-CM | POA: Diagnosis not present

## 2019-12-29 DIAGNOSIS — J309 Allergic rhinitis, unspecified: Secondary | ICD-10-CM | POA: Diagnosis not present

## 2019-12-29 DIAGNOSIS — E039 Hypothyroidism, unspecified: Secondary | ICD-10-CM | POA: Diagnosis not present

## 2019-12-29 DIAGNOSIS — E782 Mixed hyperlipidemia: Secondary | ICD-10-CM | POA: Diagnosis not present

## 2019-12-29 DIAGNOSIS — C4490 Unspecified malignant neoplasm of skin, unspecified: Secondary | ICD-10-CM | POA: Diagnosis not present

## 2019-12-29 DIAGNOSIS — G720 Drug-induced myopathy: Secondary | ICD-10-CM | POA: Diagnosis not present

## 2019-12-29 DIAGNOSIS — Z800 Family history of malignant neoplasm of digestive organs: Secondary | ICD-10-CM | POA: Diagnosis not present

## 2019-12-29 DIAGNOSIS — I700 Atherosclerosis of aorta: Secondary | ICD-10-CM | POA: Diagnosis not present

## 2019-12-29 DIAGNOSIS — K589 Irritable bowel syndrome without diarrhea: Secondary | ICD-10-CM | POA: Diagnosis not present

## 2020-01-19 DIAGNOSIS — Z1231 Encounter for screening mammogram for malignant neoplasm of breast: Secondary | ICD-10-CM | POA: Diagnosis not present

## 2020-02-01 DIAGNOSIS — L820 Inflamed seborrheic keratosis: Secondary | ICD-10-CM | POA: Diagnosis not present

## 2020-02-01 DIAGNOSIS — Z85828 Personal history of other malignant neoplasm of skin: Secondary | ICD-10-CM | POA: Diagnosis not present

## 2020-02-01 DIAGNOSIS — L918 Other hypertrophic disorders of the skin: Secondary | ICD-10-CM | POA: Diagnosis not present

## 2020-02-01 DIAGNOSIS — L570 Actinic keratosis: Secondary | ICD-10-CM | POA: Diagnosis not present

## 2020-02-01 DIAGNOSIS — L821 Other seborrheic keratosis: Secondary | ICD-10-CM | POA: Diagnosis not present

## 2020-04-03 DIAGNOSIS — E039 Hypothyroidism, unspecified: Secondary | ICD-10-CM | POA: Diagnosis not present

## 2020-04-06 DIAGNOSIS — L299 Pruritus, unspecified: Secondary | ICD-10-CM | POA: Diagnosis not present

## 2020-04-09 IMAGING — CT CT ABD-PELV W/ CM
2 of 5 series · 16 of 46 positions shown, 18 images · IV contrast (APPLIED)
Comparison: 07/25/2009

CLINICAL DATA: Acute generalized abdominal pain. History of lower
abdominal pain for 3 days

EXAM:
CT ABDOMEN AND PELVIS WITH CONTRAST
TECHNIQUE: Multidetector CT imaging of the abdomen and pelvis was performed
using the standard protocol following bolus administration of
intravenous contrast.
CONTRAST:  100mL OMNIPAQUE IOHEXOL 300 MG/ML  SOLN

[Series 3: abdomen 5.0 · axial · 0.94mm/px · z∈[+819,+1234]mm · 13 of 97 slices shown, 15 images]
[im 7/97  soft-tissue]
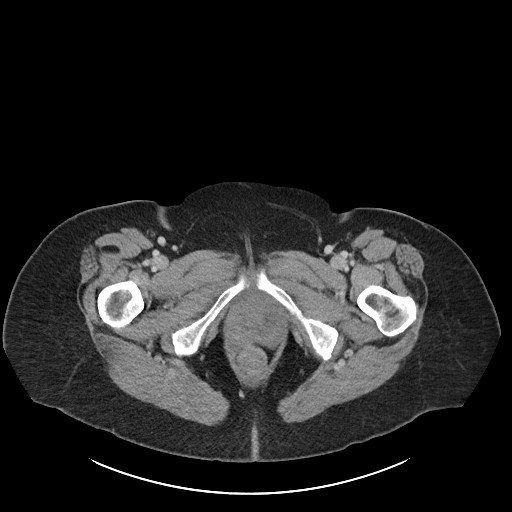
[im 7/97  bone]
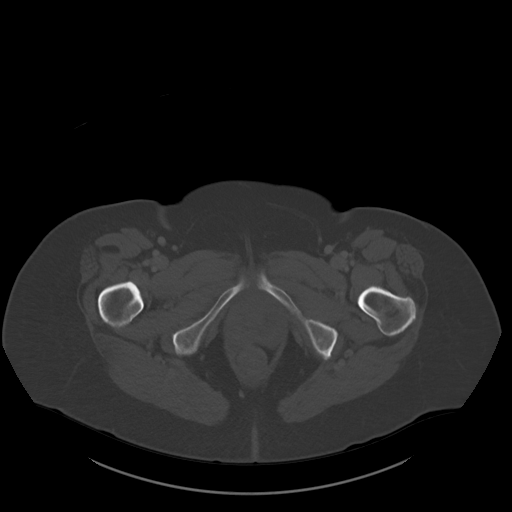
[im 14/97  soft-tissue]
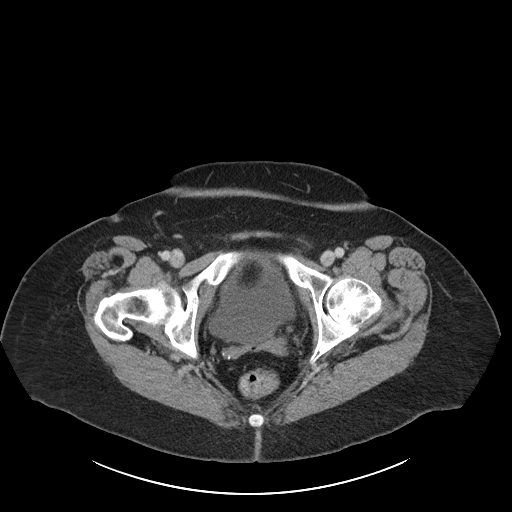
[im 21/97  soft-tissue]
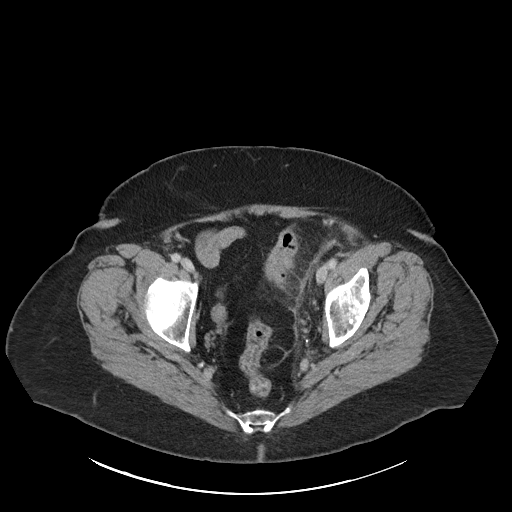
[im 28/97  soft-tissue]
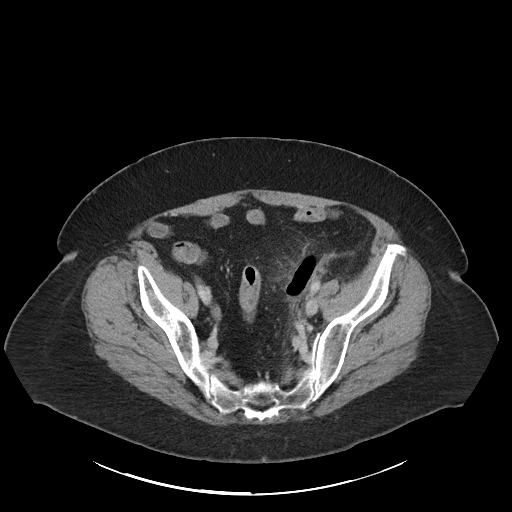
[im 35/97  soft-tissue]
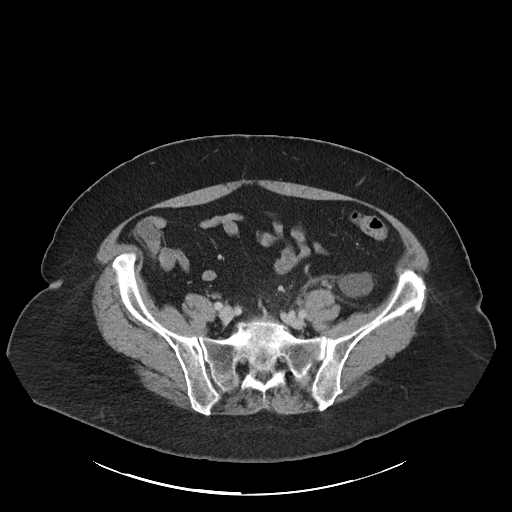
[im 42/97  soft-tissue]
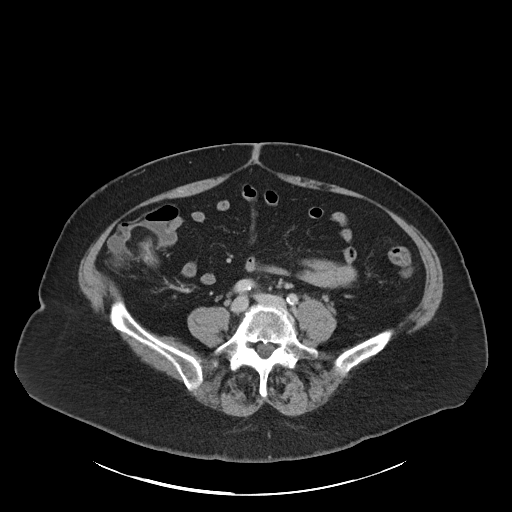
[im 49/97  soft-tissue]
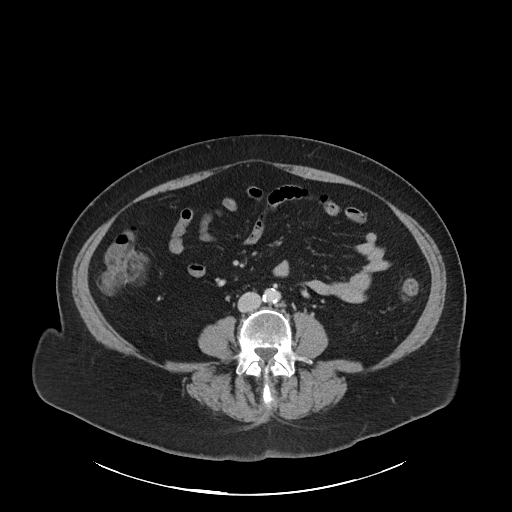
[im 55/97  soft-tissue]
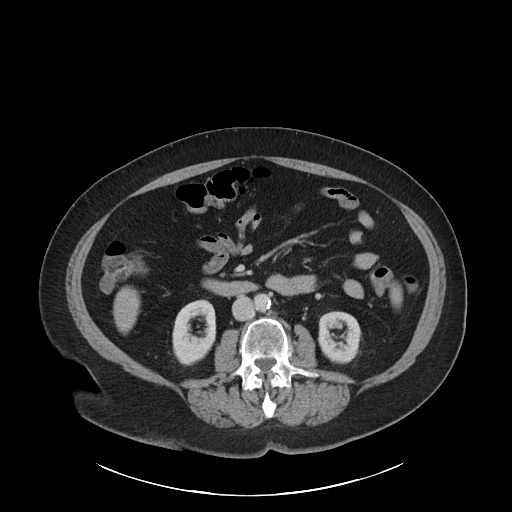
[im 62/97  soft-tissue]
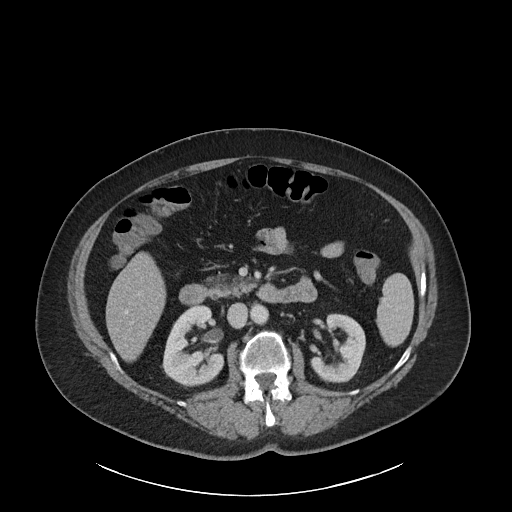
[im 62/97  bone]
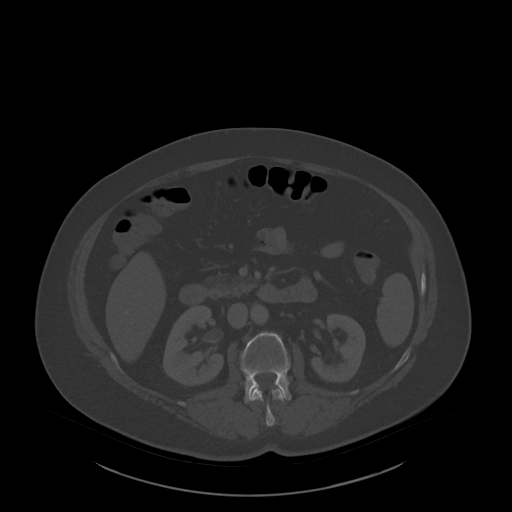
[im 69/97  soft-tissue]
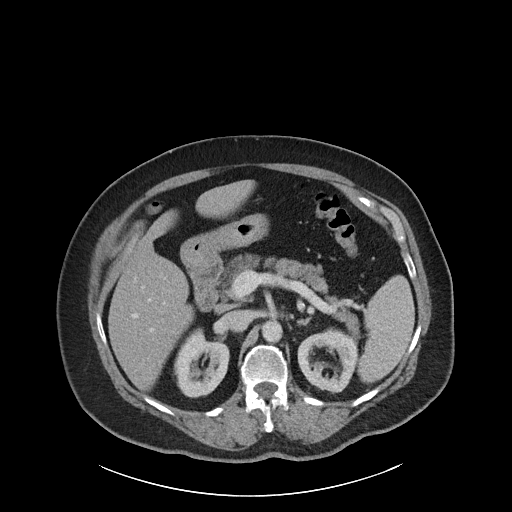
[im 76/97  soft-tissue]
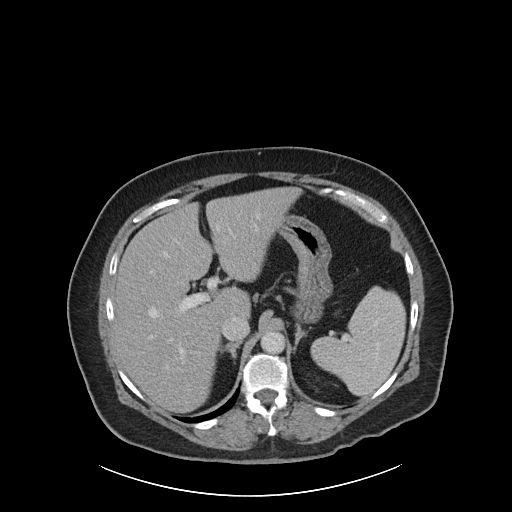
[im 83/97  soft-tissue]
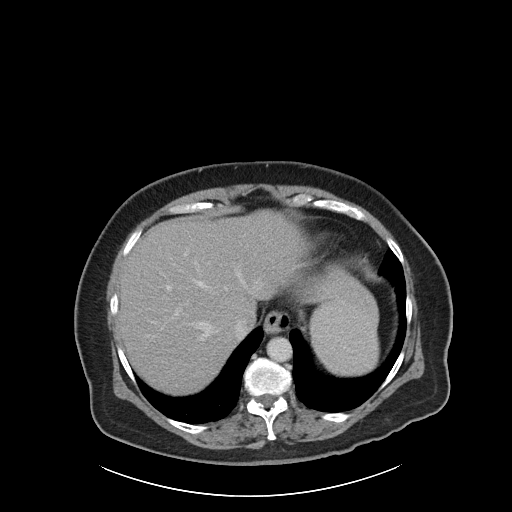
[im 90/97  soft-tissue]
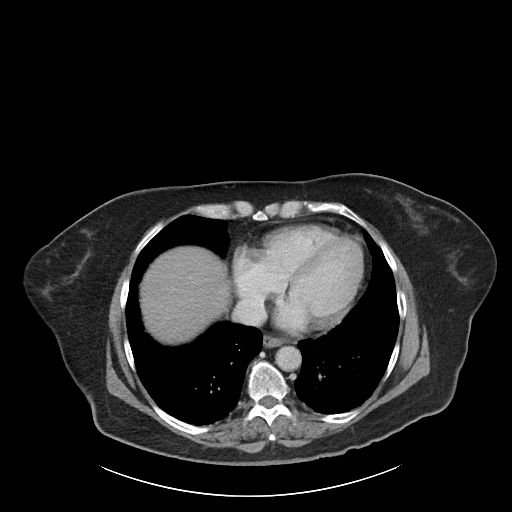

[Series 6: abdomen 3.0 mpr cor · coronal · 0.92mm/px · 3 of 94 slices shown]
[im 32/94  soft-tissue]
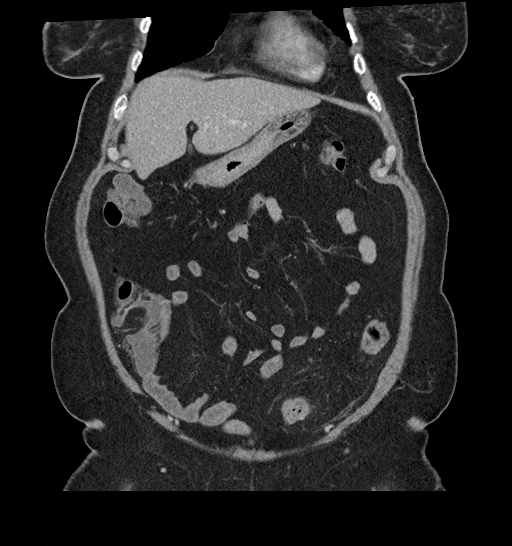
[im 42/94  soft-tissue]
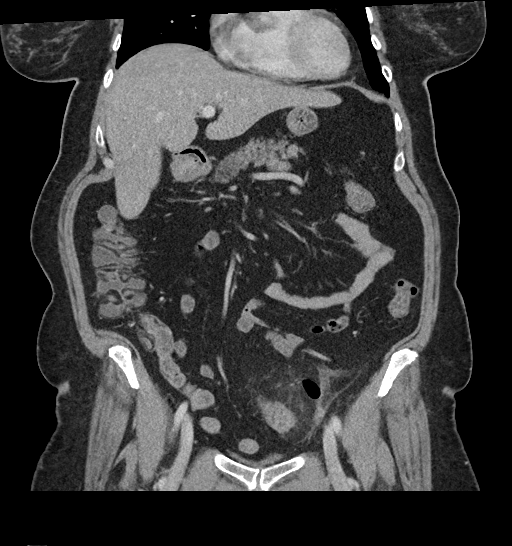
[im 52/94  soft-tissue]
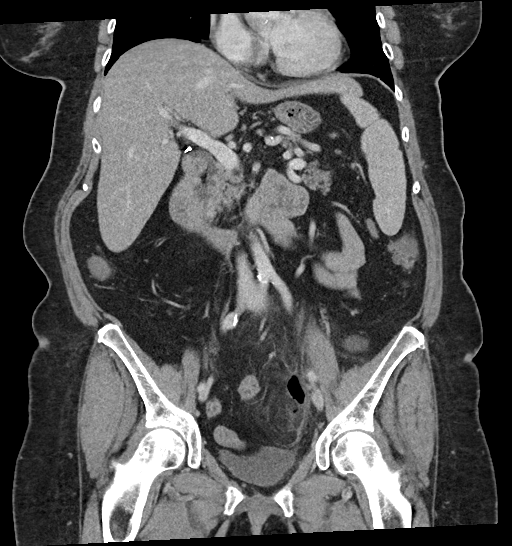

[16 of 46 positions shown; findings below may reference images not displayed]

FINDINGS: Lower chest:  No contributory findings.

Hepatobiliary: Possible hepatic steatosis.Cholecystectomy with
normal common bile duct diameter.

Pancreas: Unremarkable.

Spleen: Unremarkable.

Adrenals/Urinary Tract: Negative adrenals. No hydronephrosis or
stone. Unremarkable bladder.

Stomach/Bowel: Sigmoid diverticulosis with diverticulitis causing
colonic wall thickening and fat edema with a gas channel extending
from the tip of the offending diverticulum leftward along the pelvic
sidewall where there is a gas collection measuring 4 x 1.6 cm on
axial slices. No free pneumoperitoneum. No bowel obstruction.
Appendectomy clips.

Vascular/Lymphatic: Atherosclerosis. Low-density lobulated
retroperitoneal mass in the left iliac fossa measuring 3 cm, stable
from prior and most consistent with lymphangioma. This is separate
from the gonadal vessels.

Reproductive:Hysterectomy.

Other: No ascites or pneumoperitoneum.

Musculoskeletal: Disc degeneration greatest at L4-5 where there has
been a right-sided laminotomy.
IMPRESSION: Complicated sigmoid diverticulitis with contained perforation
causing 4 x 1.6 cm gas collection along the left pelvic sidewall.

## 2020-06-28 DIAGNOSIS — E782 Mixed hyperlipidemia: Secondary | ICD-10-CM | POA: Diagnosis not present

## 2020-06-28 DIAGNOSIS — G720 Drug-induced myopathy: Secondary | ICD-10-CM | POA: Diagnosis not present

## 2020-06-28 DIAGNOSIS — E039 Hypothyroidism, unspecified: Secondary | ICD-10-CM | POA: Diagnosis not present

## 2020-07-25 DIAGNOSIS — Z800 Family history of malignant neoplasm of digestive organs: Secondary | ICD-10-CM | POA: Diagnosis not present

## 2020-07-25 DIAGNOSIS — R140 Abdominal distension (gaseous): Secondary | ICD-10-CM | POA: Diagnosis not present

## 2020-07-25 DIAGNOSIS — R195 Other fecal abnormalities: Secondary | ICD-10-CM | POA: Diagnosis not present

## 2020-07-25 DIAGNOSIS — Z8719 Personal history of other diseases of the digestive system: Secondary | ICD-10-CM | POA: Diagnosis not present

## 2020-08-30 DIAGNOSIS — K5792 Diverticulitis of intestine, part unspecified, without perforation or abscess without bleeding: Secondary | ICD-10-CM | POA: Diagnosis not present

## 2020-10-04 DIAGNOSIS — H5203 Hypermetropia, bilateral: Secondary | ICD-10-CM | POA: Diagnosis not present

## 2020-10-08 NOTE — Progress Notes (Addendum)
[facility] Note  10/09/2020     CHIEF COMPLAINT Patient presents for Retina Evaluation   HISTORY OF PRESENT ILLNESS: Heather Allison is a 70 y.o. female who presents to the clinic today for:   HPI     Retina Evaluation   In right eye.  I, the attending physician,  performed the HPI with the patient and updated documentation appropriately.        Comments   Patient here for Retina Evaluation. Referred by Dr. Cleda Mccreedy. Patient states vision doing good. No eye pain. Dr noticed a little detachment in OD. Wanted checked out. On occasion  sees a black dot. It has been a while since saw that. No eye pain.       Last edited by Bernarda Caffey, MD on 10/10/2020 10:07 PM.     Referring physician: Madelin Headings, DO [address] [place],  [place] [address]  HISTORICAL INFORMATION:   Selected notes from the MEDICAL RECORD NUMBER Referred by Dr. Cleda Mccreedy ([place], [place]) for eval of retinoschisis OU   CURRENT MEDICATIONS: No current outpatient medications on file. (Ophthalmic Drugs)   No current facility-administered medications for this visit. (Ophthalmic Drugs)   Current Outpatient Medications (Other)  Medication Sig   acetaminophen (TYLENOL) 325 MG tablet Take 2 tablets (650 mg total) by mouth every 6 (six) hours as needed for mild pain (or temp > 100).   levothyroxine (SYNTHROID, LEVOTHROID) 125 MCG tablet Take 125 mcg by mouth daily before breakfast.   No current facility-administered medications for this visit. (Other)   REVIEW OF SYSTEMS: ROS   Positive for: Eyes Last edited by Theodore Demark, COA on 10/09/2020  1:31 PM.     ALLERGIES Allergies  Allergen Reactions   Doxycycline Nausea And Vomiting   Hydrocodone Nausea And Vomiting   Latex    Morphine And Related    Codeine Nausea And Vomiting   Erythromycin Nausea And Vomiting    And rash    PAST MEDICAL HISTORY Past Medical History:  Diagnosis Date   Diverticulitis  09/2018   Hypothyroidism    PONV (postoperative nausea and vomiting)    Varicose veins    Wears glasses    Past Surgical History:  Procedure Laterality Date   ABDOMINAL HYSTERECTOMY     APPENDECTOMY  23009   lap appendix-rt adrenal mass   BACK SURGERY  01/2018   HERNIATED DISK   CHOLECYSTECTOMY  1997   COLONOSCOPY     FOOT SURGERY  2013   right-plantar    KNEE ARTHROSCOPY WITH MEDIAL MENISECTOMY Left 08/25/2013   Procedure: KNEE ARTHROSCOPY WITH DEBRIDEMENT/SHAVING (CHONDROPLASTY),WITH MENISCECTOMY MEDIAL;  Surgeon: Ninetta Lights, MD;  Location: [facility];  Service: Orthopedics;  Laterality: Left;   ovaries removed  2009   rt overian cyst-lt ovary out   RECTAL SURGERY     torn tissue   ROTATOR CUFF REPAIR      FAMILY HISTORY History reviewed. No pertinent family history.  SOCIAL HISTORY Social History   Tobacco Use   Smoking status: Former    Types: Cigarettes    Quit date: 01/14/2008    Years since quitting: 12.7   Smokeless tobacco: Never  Vaping Use   Vaping Use: Never used  Substance Use Topics   Alcohol use: No   Drug use: No       OPHTHALMIC EXAM:  Base Eye Exam     Visual Acuity (Snellen - Linear)  Right Left   Dist cc 20/20 20/25 -1   Dist ph cc  20/20    Correction: Glasses         Tonometry (Tonopen, 1:27 PM)       Right Left   Pressure 17 14         Pupils       Dark Light Shape React APD   Right 3 2 Round Brisk None   Left 3 2 Round Brisk None         Visual Fields (Counting fingers)       Left Right    Full Full         Extraocular Movement       Right Left    Full, Ortho Full, Ortho         Neuro/Psych     Oriented x3: Yes   Mood/Affect: Normal         Dilation     Both eyes: 1.0% Mydriacyl, 2.5% Phenylephrine @ 1:26 PM           Slit Lamp and Fundus Exam     Slit Lamp Exam       Right Left   Lids/Lashes Dermato Dermato   Conjunctiva/Sclera White and quiet White and  quiet   Cornea 1+ PEE 1+ fine PEE   Anterior Chamber Deep and quiet Deep and quiet   Iris Round and dilated Round and dilated   Lens 2+ NS, 2+ cortical 2+ NS, 2+ cortical   Vitreous Synerisis Synerisis         Fundus Exam       Right Left   Disc Pink, sharp Pink. sharp   C/D Ratio 0.3 0.4   Macula Flat, good foveal reflex, mild RPE mottling, no heme or edema Flat, good foveal reflex, mild RPE mottling, no heme or edema   Vessels Mild attenuation and tortuosity Mild attenuation and tortuosity   Periphery Mild pavingstone inferiorly, scattered reticular degeneration, shallow schisis IT and bullous schisis ST periphery (1000-1130) Schisis w/ret hole and focal SRF @ 0300           Refraction     Wearing Rx       Sphere Cylinder Axis Add   Right +2.50 +1.50 174 +2.25   Left +2.00 +1.00 177 +2.25            IMAGING AND PROCEDURES  Imaging and Procedures for 10/09/2020  OCT, Retina - OU - Both Eyes       Right Eye Quality was good. Central Foveal Thickness: 296. Progression has no prior data. Findings include normal foveal contour, no IRF, no SRF, vitreomacular adhesion (Bullous schisis ST caught on widefield).   Left Eye Quality was good. Central Foveal Thickness: 298. Progression has no prior data. Findings include normal foveal contour, no IRF, vitreomacular adhesion (+Shallow SRF ST periphery caught on widefield).   Notes *Images captured and stored on drive  Diagnosis / Impression:  OD: Bullous schisis ST caught on widefield OS: +Shallow SRF ST periphery caught on widefield  Clinical management:  See below  Abbreviations: NFP - Normal foveal profile. CME - cystoid macular edema. PED - pigment epithelial detachment. IRF - intraretinal fluid. SRF - subretinal fluid. EZ - ellipsoid zone. ERM - epiretinal membrane. ORA - outer retinal atrophy. ORT - outer retinal tubulation. SRHM - subretinal hyper-reflective material. 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 Retinal edema  H35.81 OCT, Retina - OU - Both Eyes    3. Retinal hole of left eye  H33.322     4. Essential hypertension  I10     5. Hypertensive retinopathy of both eyes  H35.033     6. Combined forms of age-related cataract of both eyes  H25.813     1-3. Peripheral retinoschisis OU          - Exam and OCT shows OD: Shallow schisis IT quad and bullous schisis ST periphery (1000-1130); OS: Schisis w/ ret hole and focal SRF @ 0300          - VA 20/20 OU          - Recommend laser OS today, 9.27.22 - RBA of procedure discussed, questions answered          - Pt unable to undergo laser today due to a prior commitment          - Return next Tuesday @ 10:00 a.m. for laser retinopexy OS, sooner prn  4,5. Hypertensive retinopathy OU - discussed importance of tight BP control - monitor   6. Mixed Cataract OU - The symptoms of cataract, surgical options, and treatments and risks were discussed with patient. - discussed diagnosis and progression - not yet visually significant - monitor for now   Ophthalmic Meds Ordered this visit:  No orders of the defined types were placed in this encounter.     Return for Return Tuesday, 10.4.22 for laser retinopexy OS.  There are no Patient Instructions on file for this visit.  Explained the diagnoses, plan, and follow up with the patient and they expressed understanding.  Patient expressed understanding of the importance of proper follow up care.   This document serves as a record of services personally performed by Gardiner Sleeper, MD, PhD. It was created on their behalf by Estill Bakes, COT an ophthalmic technician. The creation of this record is the provider's dictation and/or activities during the visit.    Electronically signed by: Estill Bakes, COT 9.26.22 @ 10:25 PM  This document serves as a record of services personally performed by Gardiner Sleeper, MD, PhD. It was created on their behalf by Estill Bakes, COT an ophthalmic technician. The creation of this record is the provider's dictation and/or activities during the visit.    Electronically signed by: Estill Bakes, COT 9.27.22 @ 10:25 PM   Gardiner Sleeper, M.D., Ph.D. Diseases & Surgery of the Retina and [place] 9.27.22  I have reviewed the above documentation for accuracy and completeness, and I agree with the above. Gardiner Sleeper, M.D., Ph.D. 10/10/20 10:25 PM   Abbreviations: M myopia (nearsighted); A astigmatism; H hyperopia (farsighted); P presbyopia; Mrx spectacle prescription;  CTL contact lenses; OD right eye; OS left eye; OU both eyes  XT exotropia; ET esotropia; PEK punctate epithelial keratitis; PEE punctate epithelial erosions; DES dry eye syndrome; MGD meibomian gland dysfunction; ATs artificial tears; PFAT's preservative free artificial tears; [place] nuclear sclerotic cataract; PSC posterior subcapsular cataract; ERM epi-retinal membrane; PVD posterior vitreous detachment; RD retinal detachment; DM diabetes mellitus; DR diabetic retinopathy; NPDR non-proliferative diabetic retinopathy; PDR proliferative diabetic retinopathy; CSME clinically significant macular edema; DME diabetic macular edema; dbh dot blot hemorrhages; CWS cotton wool spot; POAG primary open angle glaucoma; C/D cup-to-disc ratio; HVF humphrey visual field; GVF goldmann visual field; OCT optical coherence tomography; IOP intraocular  pressure; BRVO Branch retinal vein occlusion; CRVO central retinal vein occlusion; CRAO central retinal artery occlusion; BRAO branch retinal artery occlusion; RT retinal tear; SB scleral buckle; PPV pars plana vitrectomy; VH Vitreous hemorrhage; PRP panretinal laser photocoagulation; IVK intravitreal kenalog; VMT vitreomacular traction; MH Macular hole;  NVD neovascularization of the disc; NVE neovascularization elsewhere; AREDS age  related eye disease study; ARMD age related macular degeneration; POAG primary open angle glaucoma; EBMD epithelial/anterior basement membrane dystrophy; ACIOL anterior chamber intraocular lens; IOL intraocular lens; PCIOL posterior chamber intraocular lens; Phaco/IOL phacoemulsification with intraocular lens placement; [place] photorefractive keratectomy; LASIK laser assisted in situ keratomileusis; HTN hypertension; DM diabetes mellitus; COPD chronic obstructive pulmonary disease

## 2020-10-09 ENCOUNTER — Other Ambulatory Visit: Payer: Self-pay

## 2020-10-09 ENCOUNTER — Encounter (INDEPENDENT_AMBULATORY_CARE_PROVIDER_SITE_OTHER): Payer: Self-pay | Admitting: Ophthalmology

## 2020-10-09 ENCOUNTER — Ambulatory Visit (INDEPENDENT_AMBULATORY_CARE_PROVIDER_SITE_OTHER): Payer: Medicare HMO | Admitting: Ophthalmology

## 2020-10-09 DIAGNOSIS — H35033 Hypertensive retinopathy, bilateral: Secondary | ICD-10-CM

## 2020-10-09 DIAGNOSIS — H33103 Unspecified retinoschisis, bilateral: Secondary | ICD-10-CM

## 2020-10-09 DIAGNOSIS — H3581 Retinal edema: Secondary | ICD-10-CM

## 2020-10-09 DIAGNOSIS — H25813 Combined forms of age-related cataract, bilateral: Secondary | ICD-10-CM | POA: Diagnosis not present

## 2020-10-09 DIAGNOSIS — I10 Essential (primary) hypertension: Secondary | ICD-10-CM

## 2020-10-09 DIAGNOSIS — H33322 Round hole, left eye: Secondary | ICD-10-CM | POA: Diagnosis not present

## 2020-10-10 ENCOUNTER — Encounter (INDEPENDENT_AMBULATORY_CARE_PROVIDER_SITE_OTHER): Payer: Self-pay | Admitting: Ophthalmology

## 2020-10-15 NOTE — Progress Notes (Signed)
[facility] Note  10/16/2020     CHIEF COMPLAINT Patient presents for Retina Follow Up   HISTORY OF PRESENT ILLNESS: Heather Allison is a 70 y.o. female who presents to the clinic today for:   HPI     Retina Follow Up   Patient presents with  Other.  In both eyes.  This started days ago.  Severity is mild.  Duration of 1 week.  Since onset it is stable.        Comments   70 y/o female pt here for laser retinopexy OS.  No change in [place] OU.  Denies pain, FOL, floaters.  AT prn OU.      Last edited by Matthew Folks, COA on 10/16/2020 10:10 AM.      Referring physician: Kelton Pillar, MD [address] [place],  [place] [address]  HISTORICAL INFORMATION:   Selected notes from the MEDICAL RECORD NUMBER Referred by Dr. Cleda Mccreedy ([place], [facility]) for eval of retinoschisis OU   CURRENT MEDICATIONS: Current Outpatient Medications (Ophthalmic Drugs)  Medication Sig   prednisoLONE acetate (PRED FORTE) 1 % ophthalmic suspension Place 1 drop into the left eye 4 (four) times daily for 7 days.   No current facility-administered medications for this visit. (Ophthalmic Drugs)   Current Outpatient Medications (Other)  Medication Sig   acetaminophen (TYLENOL) 325 MG tablet Take 2 tablets (650 mg total) by mouth every 6 (six) hours as needed for mild pain (or temp > 100).   Cholecalciferol 10 MCG (400 UNIT) CAPS Vitamin D3   fluticasone (FLONASE) 50 MCG/ACT nasal spray    ketoconazole (NIZORAL) 2 % shampoo Apply topically.   levothyroxine (SYNTHROID) 112 MCG tablet Take 112 mcg by mouth every morning.   levothyroxine (SYNTHROID, LEVOTHROID) 125 MCG tablet Take 125 mcg by mouth daily before breakfast.   No current facility-administered medications for this visit. (Other)   REVIEW OF SYSTEMS: ROS   Positive for: Eyes Negative for: Constitutional, Gastrointestinal, Neurological, Skin, Genitourinary, Musculoskeletal, HENT, Endocrine,  Cardiovascular, Respiratory, Psychiatric, Allergic/Imm, Heme/Lymph Last edited by Matthew Folks, COA on 10/16/2020 10:08 AM.      ALLERGIES Allergies  Allergen Reactions   Doxycycline Nausea And Vomiting   Hydrocodone Nausea And Vomiting   Latex    Morphine And Related    Codeine Nausea And Vomiting   Erythromycin Nausea And Vomiting    And rash    PAST MEDICAL HISTORY Past Medical History:  Diagnosis Date   Cataract    Diverticulitis 09/2018   Hypertensive retinopathy    Hypothyroidism    PONV (postoperative nausea and vomiting)    Varicose veins    Wears glasses    Past Surgical History:  Procedure Laterality Date   ABDOMINAL HYSTERECTOMY     APPENDECTOMY  23009   lap appendix-rt adrenal mass   BACK SURGERY  01/2018   HERNIATED DISK   CHOLECYSTECTOMY  1997   COLONOSCOPY     FOOT SURGERY  2013   right-plantar    KNEE ARTHROSCOPY WITH MEDIAL MENISECTOMY Left 08/25/2013   Procedure: KNEE ARTHROSCOPY WITH DEBRIDEMENT/SHAVING (CHONDROPLASTY),WITH MENISCECTOMY MEDIAL;  Surgeon: Ninetta Lights, MD;  Location: [facility];  Service: Orthopedics;  Laterality: Left;   ovaries removed  2009   rt overian cyst-lt ovary out   RECTAL SURGERY     torn tissue   ROTATOR CUFF REPAIR      FAMILY HISTORY History reviewed. No pertinent family history.  SOCIAL HISTORY Social History   Tobacco Use   Smoking status: Former    Types: Cigarettes    Quit date: 01/14/2008    Years since quitting: 12.7   Smokeless tobacco: Never  Vaping Use   Vaping Use: Never used  Substance Use Topics   Alcohol use: No   Drug use: No       OPHTHALMIC EXAM:  Base Eye Exam     Visual Acuity (Snellen - Linear)       Right Left   Dist cc 20/20 -2 20/20 -    Correction: Glasses         Tonometry (Tonopen, 10:11 AM)       Right Left   Pressure Def 13         Pupils       Dark Light Shape React APD   Right 3 2 Round Brisk None   Left 3 2 Round Brisk None          Visual Fields (Counting fingers)       Left Right     Full         Extraocular Movement       Right Left    Full, Ortho Full, Ortho         Neuro/Psych     Oriented x3: Yes   Mood/Affect: Normal         Dilation     Left eye: 1.0% Mydriacyl, 2.5% Phenylephrine @ 10:11 AM           Slit Lamp and Fundus Exam     Slit Lamp Exam       Right Left   Lids/Lashes Dermato Dermato   Conjunctiva/Sclera White and quiet White and quiet   Cornea 1+ PEE 1+ fine PEE   Anterior Chamber Deep and quiet Deep and quiet   Iris Round and dilated Round and dilated   Lens 2+ NS, 2+ cortical 2+ NS, 2+ cortical   Vitreous Synerisis Synerisis         Fundus Exam       Right Left   Disc Pink, sharp Pink. sharp   C/D Ratio 0.3 0.4   Macula Flat, good foveal reflex, mild RPE mottling, no heme or edema Flat, good foveal reflex, mild RPE mottling, no heme or edema   Vessels Mild attenuation and tortuosity Mild attenuation and tortuosity   Periphery Mild pavingstone inferiorly, scattered reticular degeneration, shallow schisis IT and bullous schisis ST periphery (1000-1130) Temporal retinoschisis w/ret hole and focal SRF @ 0300 -- schisis detachment            IMAGING AND PROCEDURES  Imaging and Procedures for 10/16/2020  OCT, Retina - OU - Both Eyes       Right Eye Quality was good. Central Foveal Thickness: 297. Progression has been stable. Findings include normal foveal contour, no IRF, no SRF, vitreomacular adhesion (Bullous schisis ST caught on widefield).   Left Eye Quality was good. Central Foveal Thickness: 295. Progression has been stable. Findings include normal foveal contour, no IRF, vitreomacular adhesion , no SRF (+Shallow SRF ST periphery caught on widefield).   Notes *Images captured and stored on drive  Diagnosis / Impression:  OD: Bullous schisis ST caught on widefield OS: +Shallow SRF within schisis cavity -- ST periphery -- caught on  widefield  Clinical management:  See below  Abbreviations: NFP - Normal foveal profile. CME - cystoid macular edema. PED - pigment epithelial detachment. IRF - intraretinal fluid. SRF -  subretinal fluid. EZ - ellipsoid zone. ERM - epiretinal membrane. ORA - outer retinal atrophy. ORT - outer retinal tubulation. SRHM - subretinal hyper-reflective material. IRHM - intraretinal hyper-reflective material      Repair Retinal Detach, Photocoag - OS - Left Eye       LASER PROCEDURE NOTE  Procedure:  Barrier laser retinopexy using slit lamp laser, LEFT eye   Diagnosis:   Retinal tear with focal retinal detachment within retinoschisis temporal periphery, LEFT eye                     Retinal break at 3 o'clock anterior to equator   Surgeon: Bernarda Caffey, MD, PhD  Anesthesia: Topical  Informed consent obtained, operative eye marked, and time out performed prior to initiation of laser.   Laser settings:  Lumenis Smart532 laser, slit lamp Lens: Mainster PRP 165 Power: 300 mW Spot size: 200 microns Duration: 30 msec  # spots: 689  Placement of laser: Using a Mainster PRP 165 contact lens at the slit lamp, laser was placed in three confluent rows posterior to temporal retinoschisis detachment with tear at 0300 with additional rows anteriorly.  Complications: None.  Patient tolerated the procedure well and received written and verbal post-procedure care information/education.            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 Retinal edema  H35.81 OCT, Retina - OU - Both Eyes    3. Retinal hole of left eye  H33.322 Repair Retinal Detach, Photocoag - OS - Left Eye    CANCELED: Repair Retinal Breaks, Laser - OS - Left Eye    4. Left retinal detachment  H33.22 Repair Retinal Detach, Photocoag - OS - Left Eye    5. Essential  hypertension  I10     6. Hypertensive retinopathy of both eyes  H35.033     7. Combined forms of age-related cataract of both eyes  H25.813     1-4. Peripheral retinoschisis OU          - OD: Shallow schisis IT quad and bullous schisis ST periphery (1000-1130);  - OS: Schisis detachment -- temporal schisis w/ ret hole and focal SRF @ 0300          - VA 20/20 OU, pt asymptomatic          - Recommend laser retinopexy OU -- OS first today, 10.04.22 for schisis RD  - pt wishes to proceed - RBA of procedure discussed, questions answered - informed consent obtained and signed - see procedure note - start PF QID OS x7 days - f/u 3 wks  5,6. Hypertensive retinopathy OU - discussed importance of tight BP control - monitor   7. Mixed Cataract OU - The symptoms of cataract, surgical options, and treatments and risks were discussed with patient. - discussed diagnosis and progression - not yet visually significant - monitor for now   Ophthalmic Meds Ordered this visit:  Meds ordered this encounter  Medications   prednisoLONE acetate (PRED FORTE) 1 % ophthalmic suspension    Sig: Place 1 drop into the left eye 4 (four) times daily for 7 days.    Dispense:  10 mL    Refill:  0     Return in about 3 weeks (around 11/06/2020) for f/u retinal hole OS, DFE, OCT.  There are no Patient Instructions on file for this visit.  Explained the diagnoses, plan, and follow up with the patient and they expressed understanding.  Patient expressed understanding of the importance of proper follow up care.   This document serves as a record of services personally performed by Gardiner Sleeper, MD, PhD. It was created on their behalf by San Jetty. Owens Shark, OA an ophthalmic technician. The creation of this record is the provider's dictation and/or activities during the visit.    Electronically signed by: San Jetty. Marguerita Merles 10.03.2022 12:59 PM   Gardiner Sleeper, M.D., Ph.D. Diseases & Surgery of the Retina  and Vitreous [facility]  I have reviewed the above documentation for accuracy and completeness, and I agree with the above. Gardiner Sleeper, M.D., Ph.D. 10/16/20 12:59 PM   Abbreviations: M myopia (nearsighted); A astigmatism; H hyperopia (farsighted); P presbyopia; Mrx spectacle prescription;  CTL contact lenses; OD right eye; OS left eye; OU both eyes  XT exotropia; ET esotropia; PEK punctate epithelial keratitis; PEE punctate epithelial erosions; DES dry eye syndrome; MGD meibomian gland dysfunction; ATs artificial tears; PFAT's preservative free artificial tears; [place] nuclear sclerotic cataract; PSC posterior subcapsular cataract; ERM epi-retinal membrane; PVD posterior vitreous detachment; RD retinal detachment; DM diabetes mellitus; DR diabetic retinopathy; NPDR non-proliferative diabetic retinopathy; PDR proliferative diabetic retinopathy; CSME clinically significant macular edema; DME diabetic macular edema; dbh dot blot hemorrhages; CWS cotton wool spot; POAG primary open angle glaucoma; C/D cup-to-disc ratio; HVF humphrey visual field; GVF goldmann visual field; OCT optical coherence tomography; IOP intraocular pressure; BRVO Branch retinal vein occlusion; CRVO central retinal vein occlusion; CRAO central retinal artery occlusion; BRAO branch retinal artery occlusion; RT retinal tear; SB scleral buckle; PPV pars plana vitrectomy; VH Vitreous hemorrhage; PRP panretinal laser photocoagulation; IVK intravitreal kenalog; VMT vitreomacular traction; MH Macular hole;  NVD neovascularization of the disc; NVE neovascularization elsewhere; AREDS age related eye disease study; ARMD age related macular degeneration; POAG primary open angle glaucoma; EBMD epithelial/anterior basement membrane dystrophy; ACIOL anterior chamber intraocular lens; IOL intraocular lens; PCIOL posterior chamber intraocular lens; Phaco/IOL phacoemulsification with intraocular lens placement; [place] photorefractive  keratectomy; LASIK laser assisted in situ keratomileusis; HTN hypertension; DM diabetes mellitus; COPD chronic obstructive pulmonary disease

## 2020-10-16 ENCOUNTER — Encounter (INDEPENDENT_AMBULATORY_CARE_PROVIDER_SITE_OTHER): Payer: Self-pay | Admitting: Ophthalmology

## 2020-10-16 ENCOUNTER — Other Ambulatory Visit: Payer: Self-pay

## 2020-10-16 ENCOUNTER — Ambulatory Visit (INDEPENDENT_AMBULATORY_CARE_PROVIDER_SITE_OTHER): Payer: Medicare HMO | Admitting: Ophthalmology

## 2020-10-16 DIAGNOSIS — H3322 Serous retinal detachment, left eye: Secondary | ICD-10-CM | POA: Diagnosis not present

## 2020-10-16 DIAGNOSIS — I10 Essential (primary) hypertension: Secondary | ICD-10-CM | POA: Diagnosis not present

## 2020-10-16 DIAGNOSIS — H33322 Round hole, left eye: Secondary | ICD-10-CM | POA: Diagnosis not present

## 2020-10-16 DIAGNOSIS — H33103 Unspecified retinoschisis, bilateral: Secondary | ICD-10-CM | POA: Diagnosis not present

## 2020-10-16 DIAGNOSIS — H3581 Retinal edema: Secondary | ICD-10-CM

## 2020-10-16 DIAGNOSIS — H25813 Combined forms of age-related cataract, bilateral: Secondary | ICD-10-CM

## 2020-10-16 DIAGNOSIS — H35033 Hypertensive retinopathy, bilateral: Secondary | ICD-10-CM | POA: Diagnosis not present

## 2020-10-16 MED ORDER — PREDNISOLONE ACETATE 1 % OP SUSP: 1.0000 [drp] | Freq: Four times a day (QID) | OPHTHALMIC | 0 refills | Status: AC

## 2020-10-31 NOTE — Progress Notes (Signed)
[facility] - Clinic Note  11/06/2020     CHIEF COMPLAINT Patient presents for Retina Follow Up   HISTORY OF PRESENT ILLNESS: Heather Allison is a 70 y.o. female who presents to the clinic today for:   HPI     Retina Follow Up   Patient presents with  Other.  In left eye.  This started 3 weeks ago.  I, the attending physician,  performed the HPI with the patient and updated documentation appropriately.        Comments   Patient here for 3 weeks retina follow up for bilateral retinoschisis. Patient states vision doing good. No eye pain.       Last edited by , , MD on 11/06/2020 12:12 PM.     Referring physician: Griffin, Elaine, MD [address] [place],  [place] [address]  HISTORICAL INFORMATION:   Selected notes from the medical record:  Referred by Dr. Cline ([place], [facility]) for eval of retinoschisis OU   CURRENT MEDICATIONS: No current outpatient medications on file. (Ophthalmic Drugs)   No current facility-administered medications for this visit. (Ophthalmic Drugs)   Current Outpatient Medications (Other)  Medication Sig   acetaminophen (TYLENOL) 325 MG tablet Take 2 tablets (650 mg total) by mouth every 6 (six) hours as needed for mild pain (or temp > 100).   Cholecalciferol 10 MCG (400 UNIT) CAPS Vitamin D3   fluticasone (FLONASE) 50 MCG/ACT nasal spray    ketoconazole (NIZORAL) 2 % shampoo Apply topically.   levothyroxine (SYNTHROID) 112 MCG tablet Take 112 mcg by mouth every morning.   levothyroxine (SYNTHROID, LEVOTHROID) 125 MCG tablet Take 125 mcg by mouth daily before breakfast.   No current facility-administered medications for this visit. (Other)   REVIEW OF SYSTEMS: ROS   Positive for: Eyes Negative for: Constitutional, Gastrointestinal, Neurological, Skin, Genitourinary, Musculoskeletal, HENT, Endocrine, Cardiovascular, Respiratory, Psychiatric, Allergic/Imm, Heme/Lymph Last edited by  Clarke, Rebecca S, COA on 11/06/2020  9:20 AM.    ALLERGIES Allergies  Allergen Reactions   Doxycycline Nausea And Vomiting   Hydrocodone Nausea And Vomiting   Latex    Morphine And Related    Codeine Nausea And Vomiting   Erythromycin Nausea And Vomiting    And rash   PAST MEDICAL HISTORY Past Medical History:  Diagnosis Date   Cataract    Diverticulitis 09/2018   Hypertensive retinopathy    Hypothyroidism    PONV (postoperative nausea and vomiting)    Varicose veins    Wears glasses    Past Surgical History:  Procedure Laterality Date   ABDOMINAL HYSTERECTOMY     APPENDECTOMY  23009   lap appendix-rt adrenal mass   BACK SURGERY  01/2018   HERNIATED DISK   CHOLECYSTECTOMY  1997   COLONOSCOPY     FOOT SURGERY  2013   right-plantar    KNEE ARTHROSCOPY WITH MEDIAL MENISECTOMY Left 08/25/2013   Procedure: KNEE ARTHROSCOPY WITH DEBRIDEMENT/SHAVING (CHONDROPLASTY),WITH MENISCECTOMY MEDIAL;  Surgeon: Daniel F Murphy, MD;  Location: [facility];  Service: Orthopedics;  Laterality: Left;   ovaries removed  2009   rt overian cyst-lt ovary out   RECTAL SURGERY     torn tissue   ROTATOR CUFF REPAIR      FAMILY HISTORY History reviewed. No pertinent family history.  SOCIAL HISTORY Social History   Tobacco Use   Smoking status: Former    Types: Cigarettes    Quit date: 01/14/2008    Years since quitting:   12.8   Smokeless tobacco: Never  Vaping Use   Vaping Use: Never used  Substance Use Topics   Alcohol use: No   Drug use: No       OPHTHALMIC EXAM: Base Eye Exam     Visual Acuity (Snellen - Linear)       Right Left   Dist cc 20/20 -1 20/20    Correction: Glasses         Tonometry (Tonopen, 9:18 AM)       Right Left   Pressure 11 09         Pupils       Dark Light Shape React APD   Right 3 2 Round Brisk None   Left 3 2 Round Brisk None         Visual Fields (Counting fingers)       Left Right    Full Full          Extraocular Movement       Right Left    Full, Ortho Full, Ortho         Neuro/Psych     Oriented x3: Yes   Mood/Affect: Normal         Dilation     Both eyes: 1.0% Mydriacyl, 2.5% Phenylephrine @ 9:18 AM           Slit Lamp and Fundus Exam     Slit Lamp Exam       Right Left   Lids/Lashes Dermato Dermato   Conjunctiva/Sclera White and quiet White and quiet   Cornea 1+ PEE 1+ fine PEE   Anterior Chamber Deep and quiet Deep and quiet   Iris Round and dilated Round and dilated   Lens 2+ NS, 2+ cortical 2+ NS, 2+ cortical   Vitreous Synerisis Synerisis         Fundus Exam       Right Left   Disc Pink, sharp Pink. sharp   C/D Ratio 0.3 0.4   Macula Flat, good foveal reflex, mild RPE mottling, no heme or edema Flat, good foveal reflex, mild RPE mottling, no heme or edema   Vessels Mild attenuation and tortuosity Mild attenuation and tortuosity   Periphery Mild pavingstone inferiorly, scattered reticular degeneration, shallow schisis and +SRF  IT (0700-0900) and bullous schisis ST periphery (1000-1130) Temporal retinoschisis w/ret hole and focal SRF @ 0300 -- schisis detachment -- good laser changes surrounding from 0100-0430           Refraction     Wearing Rx       Sphere Cylinder Axis Add   Right +2.50 +1.50 174 +2.25   Left +2.00 +1.00 177 +2.25           IMAGING AND PROCEDURES  Imaging and Procedures for 11/06/2020  OCT, Retina - OU - Both Eyes       Right Eye Quality was good. Central Foveal Thickness: 299. Progression has been stable. Findings include normal foveal contour, no IRF, vitreomacular adhesion , subretinal fluid (Shallow SRF IT periphery caught on widefield. Bullous schisis periphery -- not imaged today).   Left Eye Quality was good. Central Foveal Thickness: 302. Progression has been stable. Findings include normal foveal contour, no IRF, vitreomacular adhesion , subretinal fluid (+Shallow SRF ST periphery caught on  widefield).   Notes *Images captured and stored on drive  Diagnosis / Impression:  OD: Shallow SRF IT periphery caught on widefield.  Bullous schisis ST periphery -- not imaged today OS: +Shallow SRF   within schisis cavity -- ST periphery -- caught on widefield  Clinical management:  See below  Abbreviations: NFP - Normal foveal profile. CME - cystoid macular edema. PED - pigment epithelial detachment. IRF - intraretinal fluid. SRF - subretinal fluid. EZ - ellipsoid zone. ERM - epiretinal membrane. ORA - outer retinal atrophy. ORT - outer retinal tubulation. SRHM - subretinal hyper-reflective material. IRHM - intraretinal hyper-reflective material      Repair Retinal Detach, Photocoag - OD - Right Eye       Time Out Confirmed correct patient, procedure, site, and patient consented.   Notes LASER PROCEDURE NOTE  Procedure:  Barrier laser retinopexy using slit lamp laser, RIGHT eye   Diagnosis:   Peripheral Retinoschisis w/ focal, peripheral retinal detachment, RIGHT eye                     Bullous schisis ST periphery (10-1128)                        Shallow schisis w/ +SRF / shallow retinal detachment IT periphery (9390-3009)   Surgeon: Bernarda Caffey, MD, PhD  Anesthesia: Topical  Informed consent obtained, operative eye marked, and time out performed prior to initiation of laser.   Laser settings:  Lumenis Smart532 laser, slit lamp Lens: Mainster PRP 165 Power: 320 mW Spot size: 200 microns Duration: 30 msec  # spots: 930  Placement of laser: Using a Mainster PRP 165 contact lens at the slit lamp, laser was placed in three confluent rows posterior to ST schisis and IT schisis w/ shallow SRF -- spanning 07-1128 temporal periphery.  Notes: cortical cataract preventing good laser uptake temporal periphery around 0900  Patient tolerated the procedure well and received written and verbal post-procedure care information/education.            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 Bilateral retinal detachment  H33.23 Repair Retinal Detach, Photocoag - OD - Right Eye    3. Retinal hole of left eye  H33.322     4. Retinal edema  H35.81 OCT, Retina - OU - Both Eyes    5. Essential hypertension  I10     6. Hypertensive retinopathy of both eyes  H35.033     7. Combined forms of age-related cataract of both eyes  H25.813      1-4. 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          - OD: Shallow schisis and SRF IT (2330-0762) and bullous schisis ST periphery (1000-1130);  - OS: Schisis detachment -- temporal schisis w/ ret hole and focal SRF @ 0300 -- good laser changes surrounding from (0100-0430) - s/p Retinopexy OS, 10.04.22 -- good laser changes in place          - VA 20/20 OU, pt remains asymptomatic          - Recommend laser retinopexy OD today, 10.25.22 for schisis RD - RBA of procedure discussed, questions answered - informed consent obtained and signed - see procedure note  - start PF QID OD x7 days - f/u 3 wks -- post laser check OU  5,6. Hypertensive retinopathy OU - discussed importance of tight BP control - monitor   7. Mixed Cataract OU - The symptoms of cataract, surgical options, and treatments and risks were discussed with patient. - discussed diagnosis and progression -  not yet visually significant - monitor for now   Ophthalmic Meds Ordered this visit:  No orders of the defined types were placed in this encounter.    Return for 2-3 weeks , DFE, OCT.  There are no Patient Instructions on file for this visit.  Explained the diagnoses, plan, and follow up with the patient and they expressed understanding.  Patient expressed understanding of the importance of proper follow up care.   This document serves as a record of services personally performed by  G. , MD, PhD. It was created on their behalf by Andrew  Baxley, COT an ophthalmic technician. The creation of this record is the provider's dictation and/or activities during the visit.    Electronically signed by: Andrew Baxley, COT 10.19.22 @ 12:15 PM    G. , M.D., Ph.D. Diseases & Surgery of the Retina and Vitreous [facility]  I have reviewed the above documentation for accuracy and completeness, and I agree with the above.  G. , M.D., Ph.D. 11/06/20 12:15 PM   Abbreviations: M myopia (nearsighted); A astigmatism; H hyperopia (farsighted); P presbyopia; Mrx spectacle prescription;  CTL contact lenses; OD right eye; OS left eye; OU both eyes  XT exotropia; ET esotropia; PEK punctate epithelial keratitis; PEE punctate epithelial erosions; DES dry eye syndrome; MGD meibomian gland dysfunction; ATs artificial tears; PFAT's preservative free artificial tears; NSC nuclear sclerotic cataract; PSC posterior subcapsular cataract; ERM epi-retinal membrane; PVD posterior vitreous detachment; RD retinal detachment; DM diabetes mellitus; DR diabetic retinopathy; NPDR non-proliferative diabetic retinopathy; PDR proliferative diabetic retinopathy; CSME clinically significant macular edema; DME diabetic macular edema; dbh dot blot hemorrhages; CWS cotton wool spot; POAG primary open angle glaucoma; C/D cup-to-disc ratio; HVF humphrey visual field; GVF goldmann visual field; OCT optical coherence tomography; IOP intraocular pressure; BRVO Branch retinal vein occlusion; CRVO central retinal vein occlusion; CRAO central retinal artery occlusion; BRAO branch retinal artery occlusion; RT retinal tear; SB scleral buckle; PPV pars plana vitrectomy; VH Vitreous hemorrhage; PRP panretinal laser photocoagulation; IVK intravitreal kenalog; VMT vitreomacular traction; MH Macular hole;  NVD neovascularization of the disc; NVE neovascularization elsewhere; AREDS age related eye disease study; ARMD age related macular degeneration; POAG  primary open angle glaucoma; EBMD epithelial/anterior basement membrane dystrophy; ACIOL anterior chamber intraocular lens; IOL intraocular lens; PCIOL posterior chamber intraocular lens; Phaco/IOL phacoemulsification with intraocular lens placement; PRK photorefractive keratectomy; LASIK laser assisted in situ keratomileusis; HTN hypertension; DM diabetes mellitus; COPD chronic obstructive pulmonary disease  

## 2020-11-06 ENCOUNTER — Ambulatory Visit (INDEPENDENT_AMBULATORY_CARE_PROVIDER_SITE_OTHER): Payer: Medicare HMO | Admitting: Ophthalmology

## 2020-11-06 ENCOUNTER — Other Ambulatory Visit: Payer: Self-pay

## 2020-11-06 ENCOUNTER — Encounter (INDEPENDENT_AMBULATORY_CARE_PROVIDER_SITE_OTHER): Payer: Self-pay | Admitting: Ophthalmology

## 2020-11-06 DIAGNOSIS — H35033 Hypertensive retinopathy, bilateral: Secondary | ICD-10-CM

## 2020-11-06 DIAGNOSIS — H3323 Serous retinal detachment, bilateral: Secondary | ICD-10-CM

## 2020-11-06 DIAGNOSIS — H25813 Combined forms of age-related cataract, bilateral: Secondary | ICD-10-CM

## 2020-11-06 DIAGNOSIS — H33322 Round hole, left eye: Secondary | ICD-10-CM

## 2020-11-06 DIAGNOSIS — H3581 Retinal edema: Secondary | ICD-10-CM

## 2020-11-06 DIAGNOSIS — H3322 Serous retinal detachment, left eye: Secondary | ICD-10-CM

## 2020-11-06 DIAGNOSIS — I10 Essential (primary) hypertension: Secondary | ICD-10-CM

## 2020-11-06 DIAGNOSIS — H33103 Unspecified retinoschisis, bilateral: Secondary | ICD-10-CM | POA: Diagnosis not present

## 2020-11-26 NOTE — Progress Notes (Signed)
[facility] Note  11/27/2020     CHIEF COMPLAINT Patient presents for Retina Follow Up   HISTORY OF PRESENT ILLNESS: Heather Allison is a 70 y.o. female who presents to the clinic today for:   HPI     Retina Follow Up   Patient presents with  Other.  In both eyes.  This started months ago.  Severity is mild.  Duration of 3 weeks.  Since onset it is stable.  I, the attending physician,  performed the HPI with the patient and updated documentation appropriately.        Comments   70 y/o female pt here for 3 wk f/u.  S/p laser retinopexy for schisis OU.  No change in [place] OU.  Denies pain, FOL, floaters.  Soothe prn OU.      Last edited by Bernarda Caffey, MD on 11/27/2020 12:49 PM.     Referring physician: Kelton Pillar, MD [address]. [place],  [place] [address]  HISTORICAL INFORMATION:   Selected notes from the MEDICAL RECORD NUMBER Referred by Dr. Cleda Mccreedy (Linna Hoff, [place]) for eval of retinoschisis OU   CURRENT MEDICATIONS: No current outpatient medications on file. (Ophthalmic Drugs)   No current facility-administered medications for this visit. (Ophthalmic Drugs)   Current Outpatient Medications (Other)  Medication Sig   acetaminophen (TYLENOL) 325 MG tablet Take 2 tablets (650 mg total) by mouth every 6 (six) hours as needed for mild pain (or temp > 100).   Cholecalciferol 10 MCG (400 UNIT) CAPS Vitamin D3   fluticasone (FLONASE) 50 MCG/ACT nasal spray    ketoconazole (NIZORAL) 2 % shampoo Apply topically.   levothyroxine (SYNTHROID) 112 MCG tablet Take 112 mcg by mouth every morning.   levothyroxine (SYNTHROID, LEVOTHROID) 125 MCG tablet Take 125 mcg by mouth daily before breakfast.   No current facility-administered medications for this visit. (Other)   REVIEW OF SYSTEMS: ROS   Positive for: Eyes Negative for: Constitutional, Gastrointestinal, Neurological, Skin, Genitourinary, Musculoskeletal, HENT,  Endocrine, Cardiovascular, Respiratory, Psychiatric, Allergic/Imm, Heme/Lymph Last edited by Matthew Folks, COA on 11/27/2020  9:21 AM.     ALLERGIES Allergies  Allergen Reactions   Doxycycline Nausea And Vomiting   Hydrocodone Nausea And Vomiting   Latex    Morphine And Related    Codeine Nausea And Vomiting   Erythromycin Nausea And Vomiting    And rash   PAST MEDICAL HISTORY Past Medical History:  Diagnosis Date   Cataract    Diverticulitis 09/2018   Hypertensive retinopathy    Hypothyroidism    PONV (postoperative nausea and vomiting)    Varicose veins    Wears glasses    Past Surgical History:  Procedure Laterality Date   ABDOMINAL HYSTERECTOMY     APPENDECTOMY  23009   lap appendix-rt adrenal mass   BACK SURGERY  01/2018   HERNIATED DISK   CHOLECYSTECTOMY  1997   COLONOSCOPY     FOOT SURGERY  2013   right-plantar    KNEE ARTHROSCOPY WITH MEDIAL MENISECTOMY Left 08/25/2013   Procedure: KNEE ARTHROSCOPY WITH DEBRIDEMENT/SHAVING (CHONDROPLASTY),WITH MENISCECTOMY MEDIAL;  Surgeon: Ninetta Lights, MD;  Location: [facility];  Service: Orthopedics;  Laterality: Left;   ovaries removed  2009   rt overian cyst-lt ovary out   RECTAL SURGERY     torn tissue   ROTATOR CUFF REPAIR      FAMILY HISTORY History reviewed. No pertinent family history.  SOCIAL HISTORY Social History  Tobacco Use   Smoking status: Former    Types: Cigarettes    Quit date: 01/14/2008    Years since quitting: 12.8   Smokeless tobacco: Never  Vaping Use   Vaping Use: Never used  Substance Use Topics   Alcohol use: No   Drug use: No       OPHTHALMIC EXAM: Base Eye Exam     Visual Acuity (Snellen - Linear)       Right Left   Dist cc 20/20 20/20    Correction: Glasses         Tonometry (Tonopen, 9:23 AM)       Right Left   Pressure 10 9         Pupils       Dark Light Shape React APD   Right 3 2 Round Brisk None   Left 3 2 Round Brisk None          Visual Fields (Counting fingers)       Left Right    Full Full         Extraocular Movement       Right Left    Full, Ortho Full, Ortho         Neuro/Psych     Oriented x3: Yes   Mood/Affect: Normal         Dilation     Both eyes: 1.0% Mydriacyl, 2.5% Phenylephrine @ 9:23 AM           Slit Lamp and Fundus Exam     Slit Lamp Exam       Right Left   Lids/Lashes Dermato Dermato   Conjunctiva/Sclera White and quiet White and quiet   Cornea 1+ PEE 1+ fine PEE   Anterior Chamber Deep and quiet Deep and quiet   Iris Round and dilated Round and dilated   Lens 2+ NS, 2+ cortical 2+ NS, 2+ cortical   Vitreous Synerisis Synerisis         Fundus Exam       Right Left   Disc Pink, sharp Pink. sharp   C/D Ratio 0.3 0.4   Macula Flat, good foveal reflex, mild RPE mottling, no heme or edema Flat, good foveal reflex, mild RPE mottling, no heme or edema   Vessels mild tortuousity Mild attenuation   Periphery Mild pavingstone inferiorly, scattered reticular degeneration, shallow schisis and +SRF  IT (0700-0900) and bullous schisis ST periphery (1000-1130) -- good laser surrounding all lesions + intervening peripheral laser from 9-10, no new RT/RD or schisis Temporal retinoschisis w/ret hole and focal SRF @ 0300 -- schisis detachment -- good laser changes surrounding from 0100-0430, no new RT/RD or schisis           IMAGING AND PROCEDURES  Imaging and Procedures for 11/27/2020  OCT, Retina - OU - Both Eyes       Right Eye Quality was good. Central Foveal Thickness: 299. Progression has been stable. Findings include normal foveal contour, no IRF, vitreomacular adhesion , subretinal fluid (Shallow SRF IT periphery caught on widefield -- not imaged today; Bullous schisis ST periphery ).   Left Eye Quality was good. Central Foveal Thickness: 294. Progression has been stable. Findings include normal foveal contour, no IRF, vitreomacular adhesion , subretinal  fluid (+Shallow SRF and schisis ST periphery caught on widefield).   Notes *Images captured and stored on drive  Diagnosis / Impression:  NFP; no IRF centrally OU OD: Shallow SRF IT periphery caught on widefield -- not imaged  today; Bullous schisis ST periphery --  OS: +Shallow SRF within schisis cavity -- ST periphery -- caught on widefield  Clinical management:  See below  Abbreviations: NFP - Normal foveal profile. CME - cystoid macular edema. PED - pigment epithelial detachment. IRF - intraretinal fluid. SRF - subretinal fluid. EZ - ellipsoid zone. ERM - epiretinal membrane. ORA - outer retinal atrophy. ORT - outer retinal tubulation. SRHM - subretinal hyper-reflective material. 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 Bilateral retinal detachment  H33.23     3. Retinal hole of left eye  H33.322     4. Retinal edema  H35.81 OCT, Retina - OU - Both Eyes    5. Essential hypertension  I10     6. Hypertensive retinopathy of both eyes  H35.033     7. Combined forms of age-related cataract of both eyes  H25.813      1-4. 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 Hypertensive retinopathy OU - discussed importance of tight BP control - monitor   7. Mixed Cataract OU - The symptoms of cataract, surgical options, and treatments and risks were discussed with patient. - discussed diagnosis and  progression - not yet visually significant - monitor for now   Ophthalmic Meds Ordered this visit:  No orders of the defined types were placed in this encounter.    Return in about 3 months (around 02/27/2021) for schisis with focal RD's OU, DFE, OCT.  There are no Patient Instructions on file for this visit.  Explained the diagnoses, plan, and follow up with the patient and they expressed understanding.  Patient expressed understanding of the importance of proper follow up care.   This document serves as a record of services personally performed by Gardiner Sleeper, MD, PhD. It was created on their behalf by Estill Bakes, COT an ophthalmic technician. The creation of this record is the provider's dictation and/or activities during the visit.    Electronically signed by: Estill Bakes, COT 11.14.22 @ 12:51 PM   This document serves as a record of services personally performed by Gardiner Sleeper, MD, PhD. It was created on their behalf by San Jetty. Owens Shark, OA an ophthalmic technician. The creation of this record is the provider's dictation and/or activities during the visit.    Electronically signed by: San Jetty. Owens Shark, [place] 11.15.2022 12:51 PM   Gardiner Sleeper, M.D., Ph.D. Diseases & Surgery of the Retina and Vitreous [facility]  I have reviewed the above documentation for accuracy and completeness, and I agree with the above. Gardiner Sleeper, M.D., Ph.D. 11/27/20 12:52 PM   Abbreviations: M myopia (nearsighted); A astigmatism; H hyperopia (farsighted); P presbyopia; Mrx spectacle prescription;  CTL contact lenses; OD right eye; OS left eye; OU both eyes  XT exotropia; ET esotropia; PEK punctate epithelial keratitis; PEE punctate epithelial erosions; DES dry eye syndrome; MGD meibomian gland dysfunction; ATs artificial tears; PFAT's preservative free artificial tears; [place] nuclear sclerotic cataract; PSC posterior subcapsular cataract; ERM epi-retinal membrane; PVD  posterior vitreous detachment; RD retinal detachment; DM diabetes mellitus; DR diabetic retinopathy; NPDR non-proliferative diabetic retinopathy; PDR proliferative diabetic retinopathy; CSME clinically significant macular edema; DME diabetic macular edema; dbh dot blot hemorrhages; CWS cotton wool spot; POAG primary open angle glaucoma; C/D cup-to-disc ratio; HVF humphrey visual field; GVF goldmann visual field; OCT optical coherence tomography; IOP intraocular pressure; BRVO Branch retinal vein occlusion; CRVO central retinal vein occlusion; CRAO central retinal artery occlusion; BRAO branch retinal artery occlusion; RT retinal tear; SB scleral buckle; PPV pars plana vitrectomy; VH Vitreous hemorrhage; PRP panretinal laser photocoagulation; IVK intravitreal kenalog; VMT vitreomacular traction; MH Macular hole;  NVD neovascularization of the disc; NVE neovascularization elsewhere; AREDS age related eye disease study; ARMD age related macular degeneration; POAG primary open angle glaucoma; EBMD epithelial/anterior basement membrane dystrophy; ACIOL anterior chamber intraocular lens; IOL intraocular lens; PCIOL posterior chamber intraocular lens; Phaco/IOL phacoemulsification with intraocular lens placement; [place] photorefractive keratectomy; LASIK laser assisted in situ keratomileusis; HTN hypertension; DM diabetes mellitus; COPD chronic obstructive pulmonary disease

## 2020-11-27 ENCOUNTER — Ambulatory Visit (INDEPENDENT_AMBULATORY_CARE_PROVIDER_SITE_OTHER): Payer: Medicare HMO | Admitting: Ophthalmology

## 2020-11-27 ENCOUNTER — Other Ambulatory Visit: Payer: Self-pay

## 2020-11-27 ENCOUNTER — Encounter (INDEPENDENT_AMBULATORY_CARE_PROVIDER_SITE_OTHER): Payer: Self-pay | Admitting: Ophthalmology

## 2020-11-27 DIAGNOSIS — H3581 Retinal edema: Secondary | ICD-10-CM

## 2020-11-27 DIAGNOSIS — H35033 Hypertensive retinopathy, bilateral: Secondary | ICD-10-CM

## 2020-11-27 DIAGNOSIS — H33322 Round hole, left eye: Secondary | ICD-10-CM

## 2020-11-27 DIAGNOSIS — H25813 Combined forms of age-related cataract, bilateral: Secondary | ICD-10-CM | POA: Diagnosis not present

## 2020-11-27 DIAGNOSIS — H3323 Serous retinal detachment, bilateral: Secondary | ICD-10-CM

## 2020-11-27 DIAGNOSIS — I10 Essential (primary) hypertension: Secondary | ICD-10-CM | POA: Diagnosis not present

## 2020-11-27 DIAGNOSIS — H33103 Unspecified retinoschisis, bilateral: Secondary | ICD-10-CM

## 2020-12-03 ENCOUNTER — Telehealth (INDEPENDENT_AMBULATORY_CARE_PROVIDER_SITE_OTHER): Payer: Self-pay

## 2020-12-03 NOTE — Telephone Encounter (Signed)
Pt called w/ c/o OD bottom eyelid swollen, red, watering and sore since last visit w/ Dr. Coralyn Pear. Pt is out of town at [employer]. No reports of itching or discharge. Per Dr. Coralyn Pear, informed pt to use cold compress and Ats prn and to monitor. Pt instructed to call if condition worsens and offered appointment when she returns if necessary. I did advise pt to be seen locally if condition becomes emergent. MS

## 2020-12-13 ENCOUNTER — Other Ambulatory Visit: Payer: Self-pay | Admitting: Physician Assistant

## 2020-12-13 ENCOUNTER — Ambulatory Visit
Admission: RE | Admit: 2020-12-13 | Discharge: 2020-12-13 | Disposition: A | Discharge status: Home / Self Care | Payer: Medicare HMO | Source: Ambulatory Visit | Attending: Physician Assistant | Admitting: Physician Assistant

## 2020-12-13 DIAGNOSIS — W19XXXA Unspecified fall, initial encounter: Secondary | ICD-10-CM

## 2020-12-13 DIAGNOSIS — Z9889 Other specified postprocedural states: Secondary | ICD-10-CM | POA: Diagnosis not present

## 2020-12-13 DIAGNOSIS — Y92009 Unspecified place in unspecified non-institutional (private) residence as the place of occurrence of the external cause: Secondary | ICD-10-CM

## 2020-12-13 DIAGNOSIS — M25551 Pain in right hip: Secondary | ICD-10-CM | POA: Diagnosis not present

## 2020-12-13 DIAGNOSIS — M25552 Pain in left hip: Secondary | ICD-10-CM | POA: Diagnosis not present

## 2020-12-13 DIAGNOSIS — M47816 Spondylosis without myelopathy or radiculopathy, lumbar region: Secondary | ICD-10-CM | POA: Diagnosis not present

## 2020-12-18 DIAGNOSIS — K5730 Diverticulosis of large intestine without perforation or abscess without bleeding: Secondary | ICD-10-CM | POA: Diagnosis not present

## 2020-12-18 DIAGNOSIS — D123 Benign neoplasm of transverse colon: Secondary | ICD-10-CM | POA: Diagnosis not present

## 2020-12-18 DIAGNOSIS — Z1211 Encounter for screening for malignant neoplasm of colon: Secondary | ICD-10-CM | POA: Diagnosis not present

## 2020-12-18 DIAGNOSIS — Z800 Family history of malignant neoplasm of digestive organs: Secondary | ICD-10-CM | POA: Diagnosis not present

## 2020-12-18 DIAGNOSIS — K635 Polyp of colon: Secondary | ICD-10-CM | POA: Diagnosis not present

## 2020-12-18 DIAGNOSIS — K5790 Diverticulosis of intestine, part unspecified, without perforation or abscess without bleeding: Secondary | ICD-10-CM | POA: Diagnosis not present

## 2020-12-18 DIAGNOSIS — K648 Other hemorrhoids: Secondary | ICD-10-CM | POA: Diagnosis not present

## 2020-12-27 DIAGNOSIS — U071 COVID-19: Secondary | ICD-10-CM | POA: Diagnosis not present

## 2020-12-27 DIAGNOSIS — I700 Atherosclerosis of aorta: Secondary | ICD-10-CM | POA: Diagnosis not present

## 2020-12-27 DIAGNOSIS — H35033 Hypertensive retinopathy, bilateral: Secondary | ICD-10-CM | POA: Diagnosis not present

## 2020-12-27 DIAGNOSIS — R059 Cough, unspecified: Secondary | ICD-10-CM | POA: Diagnosis not present

## 2021-01-11 DIAGNOSIS — Z23 Encounter for immunization: Secondary | ICD-10-CM | POA: Diagnosis not present

## 2021-01-11 DIAGNOSIS — E039 Hypothyroidism, unspecified: Secondary | ICD-10-CM | POA: Diagnosis not present

## 2021-01-11 DIAGNOSIS — E782 Mixed hyperlipidemia: Secondary | ICD-10-CM | POA: Diagnosis not present

## 2021-01-11 DIAGNOSIS — Z0000 Encounter for general adult medical examination without abnormal findings: Secondary | ICD-10-CM | POA: Diagnosis not present

## 2021-01-21 DIAGNOSIS — Z1231 Encounter for screening mammogram for malignant neoplasm of breast: Secondary | ICD-10-CM | POA: Diagnosis not present

## 2021-02-20 DIAGNOSIS — L918 Other hypertrophic disorders of the skin: Secondary | ICD-10-CM | POA: Diagnosis not present

## 2021-02-20 DIAGNOSIS — Z85828 Personal history of other malignant neoplasm of skin: Secondary | ICD-10-CM | POA: Diagnosis not present

## 2021-02-20 DIAGNOSIS — L820 Inflamed seborrheic keratosis: Secondary | ICD-10-CM | POA: Diagnosis not present

## 2021-02-20 DIAGNOSIS — L570 Actinic keratosis: Secondary | ICD-10-CM | POA: Diagnosis not present

## 2021-02-20 DIAGNOSIS — L821 Other seborrheic keratosis: Secondary | ICD-10-CM | POA: Diagnosis not present

## 2021-02-20 DIAGNOSIS — D1801 Hemangioma of skin and subcutaneous tissue: Secondary | ICD-10-CM | POA: Diagnosis not present

## 2021-02-20 DIAGNOSIS — D225 Melanocytic nevi of trunk: Secondary | ICD-10-CM | POA: Diagnosis not present

## 2021-02-21 NOTE — Progress Notes (Signed)
[facility] Note  02/27/2021     CHIEF COMPLAINT Patient presents for Retina Follow Up    HISTORY OF PRESENT ILLNESS: Heather Allison is a 71 y.o. female who presents to the clinic today for:   HPI     Retina Follow Up   Patient presents with  Other.  In both eyes.  This started 3 months ago.  I, the attending physician,  performed the HPI with the patient and updated documentation appropriately.        Comments   Patient here for 3 months retina follow up for retinoschisis OU. Patient states vision doing good. No eye pain.       Last edited by Bernarda Caffey, MD on 02/27/2021  5:02 PM.      Referring physician: Kelton Pillar, MD [address]. [place],  [place] [address]  HISTORICAL INFORMATION:   Selected notes from the MEDICAL RECORD NUMBER Referred by Dr. Cleda Mccreedy (Linna Hoff, [place]) for eval of retinoschisis OU   CURRENT MEDICATIONS: No current outpatient medications on file. (Ophthalmic Drugs)   No current facility-administered medications for this visit. (Ophthalmic Drugs)   Current Outpatient Medications (Other)  Medication Sig   acetaminophen (TYLENOL) 325 MG tablet Take 2 tablets (650 mg total) by mouth every 6 (six) hours as needed for mild pain (or temp > 100).   Cholecalciferol 10 MCG (400 UNIT) CAPS Vitamin D3   fluticasone (FLONASE) 50 MCG/ACT nasal spray    ketoconazole (NIZORAL) 2 % shampoo Apply topically.   levothyroxine (SYNTHROID) 112 MCG tablet Take 112 mcg by mouth every morning.   levothyroxine (SYNTHROID, LEVOTHROID) 125 MCG tablet Take 125 mcg by mouth daily before breakfast.   No current facility-administered medications for this visit. (Other)   REVIEW OF SYSTEMS: ROS   Positive for: Eyes Negative for: Constitutional, Gastrointestinal, Neurological, Skin, Genitourinary, Musculoskeletal, HENT, Endocrine, Cardiovascular, Respiratory, Psychiatric, Allergic/Imm, Heme/Lymph Last edited by  Theodore Demark, COA on 02/27/2021  9:22 AM.      ALLERGIES Allergies  Allergen Reactions   Doxycycline Nausea And Vomiting   Hydrocodone Nausea And Vomiting   Latex    Morphine And Related    Codeine Nausea And Vomiting   Erythromycin Nausea And Vomiting    And rash   PAST MEDICAL HISTORY Past Medical History:  Diagnosis Date   Cataract    Diverticulitis 09/2018   Hypertensive retinopathy    Hypothyroidism    PONV (postoperative nausea and vomiting)    Varicose veins    Wears glasses    Past Surgical History:  Procedure Laterality Date   ABDOMINAL HYSTERECTOMY     APPENDECTOMY  23009   lap appendix-rt adrenal mass   BACK SURGERY  01/2018   HERNIATED DISK   CHOLECYSTECTOMY  1997   COLONOSCOPY     FOOT SURGERY  2013   right-plantar    KNEE ARTHROSCOPY WITH MEDIAL MENISECTOMY Left 08/25/2013   Procedure: KNEE ARTHROSCOPY WITH DEBRIDEMENT/SHAVING (CHONDROPLASTY),WITH MENISCECTOMY MEDIAL;  Surgeon: Ninetta Lights, MD;  Location: [facility];  Service: Orthopedics;  Laterality: Left;   ovaries removed  2009   rt overian cyst-lt ovary out   RECTAL SURGERY     torn tissue   ROTATOR CUFF REPAIR      FAMILY HISTORY History reviewed. No pertinent family history.  SOCIAL HISTORY Social History   Tobacco Use   Smoking status: Former    Types: Cigarettes    Quit date: 01/14/2008  Years since quitting: 13.1   Smokeless tobacco: Never  Vaping Use   Vaping Use: Never used  Substance Use Topics   Alcohol use: No   Drug use: No       OPHTHALMIC EXAM: Base Eye Exam     Visual Acuity (Snellen - Linear)       Right Left   Dist cc 20/20 -1 20/20    Correction: Glasses         Tonometry (Tonopen, 9:20 AM)       Right Left   Pressure 19 07         Pupils       Dark Light Shape React APD   Right 3 2 Round Brisk None   Left 3 2 Round Brisk None         Visual Fields (Counting fingers)       Left Right    Full Full          Extraocular Movement       Right Left    Full, Ortho Full, Ortho         Neuro/Psych     Oriented x3: Yes   Mood/Affect: Normal         Dilation     Both eyes: 1.0% Mydriacyl, 2.5% Phenylephrine @ 9:20 AM           Slit Lamp and Fundus Exam     Slit Lamp Exam       Right Left   Lids/Lashes Dermato Dermato   Conjunctiva/Sclera White and quiet White and quiet   Cornea 1+ PEE 1+ fine PEE   Anterior Chamber Deep and quiet Deep and quiet   Iris Round and dilated Round and dilated   Lens 2+ NS, 2+ cortical 2+ NS, 2+ cortical   Anterior Vitreous Synerisis Synerisis         Fundus Exam       Right Left   Disc Pink, sharp Pink. sharp   C/D Ratio 0.3 0.4   Macula Flat, good foveal reflex, mild RPE mottling, no heme or edema Flat, good foveal reflex, mild RPE mottling, no heme or edema   Vessels mild tortuousity Mild attenuation   Periphery Mild pavingstone inferiorly, scattered reticular degeneration, shallow schisis and +SRF  IT (0700-0900) and bullous schisis ST periphery (1000-1130) -- good laser surrounding all lesions + intervening peripheral laser from 9-10, small gap in laser at 0800; no new RT/RD or schisis Temporal retinoschisis w/ret hole and focal SRF @ 0300 -- schisis detachment -- good laser changes surrounding from 0100-0430, no new RT/RD or schisis           Refraction     Wearing Rx       Sphere Cylinder Axis Add   Right +2.50 +1.50 174 +2.25   Left +2.00 +1.00 177 +2.25           IMAGING AND PROCEDURES  Imaging and Procedures for 02/27/2021  OCT, Retina - OU - Both Eyes       Right Eye Quality was good. Central Foveal Thickness: 298. Progression has been stable. Findings include normal foveal contour, no IRF, vitreomacular adhesion , subretinal fluid (Shallow SRF IT periphery caught on widefield; Bullous schisis ST periphery ).   Left Eye Quality was good. Central Foveal Thickness: 297. Progression has been stable. Findings include  normal foveal contour, no IRF, vitreomacular adhesion , subretinal fluid (+Shallow SRF and schisis ST periphery caught on widefield).   Notes *Images captured and  stored on drive  Diagnosis / Impression:  NFP; no IRF centrally OU OD: Shallow SRF IT periphery caught on widefield; Bullous schisis ST periphery --  OS: +Shallow SRF within schisis cavity -- ST periphery -- caught on widefield  Clinical management:  See below  Abbreviations: NFP - Normal foveal profile. CME - cystoid macular edema. PED - pigment epithelial detachment. IRF - intraretinal fluid. SRF - subretinal fluid. EZ - ellipsoid zone. ERM - epiretinal membrane. ORA - outer retinal atrophy. ORT - outer retinal tubulation. SRHM - subretinal hyper-reflective material. IRHM - intraretinal hyper-reflective material     LASER PROCEDURE NOTE   Procedure:     Touch-up Barrier laser retinopexy using slit lamp laser, RIGHT eye    Diagnosis:       Peripheral Retinoschisis w/ focal, peripheral retinal detachment, RIGHT eye                       Bullous schisis ST periphery (10-1128)                        Shallow schisis w/ +SRF / shallow retinal detachment IT periphery (5573-2202)    Surgeon:        Bernarda Caffey, MD, PhD   Anesthesia:    Topical   Informed consent obtained, operative eye marked, and time out performed prior to initiation of laser.    Laser settings:  Lumenis [id number] laser, slit lamp Lens: [place] PRP 165 Power: 290 mW Spot size: 200 microns Duration: 30 msec  # spots: 321   Placement of laser: Using a [place]ster PRP 165 contact lens at the slit lamp, supplemental laser was placed in three confluent rows posterior to temporal schisis w/ shallow SRF at the 0800-0900 position.   Patient tolerated the procedure well and received written and verbal post-procedure care information/education.        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  Bilateral retinal detachment  H33.23     3. Essential hypertension  I10     4. Hypertensive retinopathy of both eyes  H35.033     5. Combined forms of age-related cataract of both eyes  H25.813       1-4. 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 Hypertensive retinopathy OU - discussed importance of tight BP control - monitor   7. Mixed Cataract OU - The symptoms of cataract, surgical options, and treatments and risks were discussed with patient. - discussed diagnosis and progression - not yet visually significant - monitor for now  Ophthalmic Meds Ordered this visit:  No orders of the defined types were placed in this encounter.  Return in about 2 weeks (around 03/13/2021) for DFE, OCT.  There are no Patient Instructions on file for this visit.  Explained the diagnoses, plan, and follow up with the patient and they expressed understanding.  Patient expressed understanding of the importance of proper follow up care.   This document serves as a record of services personally performed by Gardiner Sleeper, MD, PhD. It was created on their behalf by Orvan Falconer, an ophthalmic technician. The creation of this record is the provider's dictation and/or activities during the visit.    Electronically signed by: Orvan Falconer, OA, 02/27/21  5:02 PM  This document serves as a record of  services personally performed by Gardiner Sleeper, MD, PhD. It was created on their behalf by Roselee Nova, COMT. The creation of this record is the provider's dictation and/or activities during the visit.  Electronically signed by: Roselee Nova, COMT 02/27/21 5:02 PM  Gardiner Sleeper, M.D., Ph.D. Diseases & Surgery of the Retina and Vitreous [facility]  I have reviewed the above documentation for accuracy and completeness, and I agree with the above. Gardiner Sleeper, M.D., Ph.D. 02/27/21 5:07 PM  Abbreviations: M myopia (nearsighted); A astigmatism; H hyperopia (farsighted); P presbyopia; Mrx spectacle prescription;  CTL contact lenses; OD right eye; OS left eye; OU both eyes  XT exotropia; ET esotropia; PEK punctate epithelial keratitis; PEE punctate epithelial erosions; DES dry eye syndrome; MGD meibomian gland dysfunction; ATs artificial tears; PFAT's preservative free artificial tears; [place] nuclear sclerotic cataract; PSC posterior subcapsular cataract; ERM epi-retinal membrane; PVD posterior vitreous detachment; RD retinal detachment; DM diabetes mellitus; DR diabetic retinopathy; NPDR non-proliferative diabetic retinopathy; PDR proliferative diabetic retinopathy; CSME clinically significant macular edema; DME diabetic macular edema; dbh dot blot hemorrhages; CWS cotton wool spot; POAG primary open angle glaucoma; C/D cup-to-disc ratio; HVF humphrey visual field; GVF goldmann visual field; OCT optical coherence tomography; IOP intraocular pressure; BRVO Branch retinal vein occlusion; CRVO central retinal vein occlusion; CRAO central retinal artery occlusion; BRAO branch retinal artery occlusion; RT retinal tear; SB scleral buckle; PPV pars plana vitrectomy; VH Vitreous hemorrhage; PRP panretinal laser photocoagulation; IVK intravitreal kenalog; VMT vitreomacular traction; MH Macular hole;  NVD neovascularization of the disc; NVE neovascularization elsewhere; AREDS age related  eye disease study; ARMD age related macular degeneration; POAG primary open angle glaucoma; EBMD epithelial/anterior basement membrane dystrophy; ACIOL anterior chamber intraocular lens; IOL intraocular lens; PCIOL posterior chamber intraocular lens; Phaco/IOL phacoemulsification with intraocular lens placement; [place] photorefractive keratectomy; LASIK laser assisted in situ keratomileusis; HTN hypertension; DM diabetes mellitus; COPD chronic obstructive pulmonary disease

## 2021-02-27 ENCOUNTER — Ambulatory Visit (INDEPENDENT_AMBULATORY_CARE_PROVIDER_SITE_OTHER): Payer: Medicare HMO | Admitting: Ophthalmology

## 2021-02-27 ENCOUNTER — Other Ambulatory Visit: Payer: Self-pay

## 2021-02-27 ENCOUNTER — Encounter (INDEPENDENT_AMBULATORY_CARE_PROVIDER_SITE_OTHER): Payer: Self-pay | Admitting: Ophthalmology

## 2021-02-27 DIAGNOSIS — H3323 Serous retinal detachment, bilateral: Secondary | ICD-10-CM | POA: Diagnosis not present

## 2021-02-27 DIAGNOSIS — H33103 Unspecified retinoschisis, bilateral: Secondary | ICD-10-CM | POA: Diagnosis not present

## 2021-02-27 DIAGNOSIS — H35033 Hypertensive retinopathy, bilateral: Secondary | ICD-10-CM

## 2021-02-27 DIAGNOSIS — H25813 Combined forms of age-related cataract, bilateral: Secondary | ICD-10-CM

## 2021-02-27 DIAGNOSIS — I10 Essential (primary) hypertension: Secondary | ICD-10-CM

## 2021-03-07 NOTE — Progress Notes (Signed)
[facility] Note  03/13/2021     CHIEF COMPLAINT Patient presents for Retina Follow Up    HISTORY OF PRESENT ILLNESS: Heather Allison is a 71 y.o. female who presents to the clinic today for:   HPI     Retina Follow Up   Patient presents with  Other.  In both eyes.  Severity is moderate.  Duration of 2 weeks.  Since onset it is stable.  I, the attending physician,  performed the HPI with the patient and updated documentation appropriately.        Comments   Pt here for 2 wk ret f/u for schisis OD, s/p retinopexy OD 02/27/21. Pt states no changes in [place]. She does want to report that the Lotemax samples given after laser were dried up when she opened the box to use the bottle. Pt had some drops previously used and was able to complete PF OD QID x 7 days.       Last edited by Bernarda Caffey, MD on 03/13/2021  9:55 AM.    Pt states she had no problems after the last laser procedure, she used PF QID for 7 days afterwards  Referring physician: Kelton Pillar, MD [address] [place],  [place] [address]  HISTORICAL INFORMATION:   Selected notes from the MEDICAL RECORD NUMBER Referred by Dr. Cleda Mccreedy ([facility], [place]) for eval of retinoschisis OU   CURRENT MEDICATIONS: No current outpatient medications on file. (Ophthalmic Drugs)   No current facility-administered medications for this visit. (Ophthalmic Drugs)   Current Outpatient Medications (Other)  Medication Sig   acetaminophen (TYLENOL) 325 MG tablet Take 2 tablets (650 mg total) by mouth every 6 (six) hours as needed for mild pain (or temp > 100).   Cholecalciferol 10 MCG (400 UNIT) CAPS Vitamin D3   fluticasone (FLONASE) 50 MCG/ACT nasal spray    ketoconazole (NIZORAL) 2 % shampoo Apply topically.   levothyroxine (SYNTHROID) 112 MCG tablet Take 112 mcg by mouth every morning.   levothyroxine (SYNTHROID, LEVOTHROID) 125 MCG tablet Take 125 mcg by mouth daily before breakfast.    No current facility-administered medications for this visit. (Other)   REVIEW OF SYSTEMS: ROS   Positive for: Eyes Negative for: Constitutional, Gastrointestinal, Neurological, Skin, Genitourinary, Musculoskeletal, HENT, Endocrine, Cardiovascular, Respiratory, Psychiatric, Allergic/Imm, Heme/Lymph Last edited by Kingsley Spittle, COT on 03/13/2021  9:13 AM.     ALLERGIES Allergies  Allergen Reactions   Doxycycline Nausea And Vomiting   Hydrocodone Nausea And Vomiting   Latex    Morphine And Related    Codeine Nausea And Vomiting   Erythromycin Nausea And Vomiting    And rash   PAST MEDICAL HISTORY Past Medical History:  Diagnosis Date   Cataract    Diverticulitis 09/2018   Hypertensive retinopathy    Hypothyroidism    PONV (postoperative nausea and vomiting)    Varicose veins    Wears glasses    Past Surgical History:  Procedure Laterality Date   ABDOMINAL HYSTERECTOMY     APPENDECTOMY  23009   lap appendix-rt adrenal mass   BACK SURGERY  01/2018   HERNIATED DISK   CHOLECYSTECTOMY  1997   COLONOSCOPY     FOOT SURGERY  2013   right-plantar    KNEE ARTHROSCOPY WITH MEDIAL MENISECTOMY Left 08/25/2013   Procedure: KNEE ARTHROSCOPY WITH DEBRIDEMENT/SHAVING (CHONDROPLASTY),WITH MENISCECTOMY MEDIAL;  Surgeon: Ninetta Lights, MD;  Location: [facility];  Service: Orthopedics;  Laterality:  Left;   ovaries removed  2009   rt overian cyst-lt ovary out   RECTAL SURGERY     torn tissue   ROTATOR CUFF REPAIR      FAMILY HISTORY History reviewed. No pertinent family history.  SOCIAL HISTORY Social History   Tobacco Use   Smoking status: Former    Types: Cigarettes    Quit date: 01/14/2008    Years since quitting: 13.1   Smokeless tobacco: Never  Vaping Use   Vaping Use: Never used  Substance Use Topics   Alcohol use: No   Drug use: No       OPHTHALMIC EXAM: Base Eye Exam     Visual Acuity (Snellen - Linear)       Right Left   Dist cc  20/20 20/20    Correction: Glasses         Tonometry (Tonopen, 9:20 AM)       Right Left   Pressure 12 14         Pupils       Dark Light Shape React APD   Right 3 2 Round Brisk None   Left 3 2 Round Brisk None         Visual Fields (Counting fingers)       Left Right    Full Full         Neuro/Psych     Oriented x3: Yes   Mood/Affect: Normal         Dilation     Right eye: 1.0% Mydriacyl, 2.5% Phenylephrine @ 9:20 AM           Slit Lamp and Fundus Exam     Slit Lamp Exam       Right Left   Lids/Lashes Dermato Dermato   Conjunctiva/Sclera White and quiet White and quiet   Cornea 1+ PEE 1+ fine PEE   Anterior Chamber Deep and quiet Deep and quiet   Iris Round and dilated Round and dilated   Lens 2+ NS, 2+ cortical 2+ NS, 2+ cortical   Anterior Vitreous Synerisis Synerisis         Fundus Exam       Right Left   Disc Pink, sharp Pink. sharp   C/D Ratio 0.3 0.4   Macula Flat, good foveal reflex, mild RPE mottling, no heme or edema Flat, good foveal reflex, mild RPE mottling, no heme or edema   Vessels mild tortuosity Mild attenuation   Periphery Mild pavingstone inferiorly, scattered reticular degeneration, shallow schisis and +SRF  IT (0700-0900) and bullous schisis ST periphery (1000-1130) -- good laser surrounding all lesions from 0700 to 1130--good fill in  changes at 0800-0900; no new RT/RD or schisis Temporal retinoschisis w/ret hole and focal SRF @ 0300 -- schisis detachment -- good laser changes surrounding from 0100-0430, no new RT/RD or schisis           Refraction     Wearing Rx       Sphere Cylinder Axis Add   Right +2.50 +1.50 174 +2.25   Left +2.00 +1.00 177 +2.25           IMAGING AND PROCEDURES  Imaging and Procedures for 03/13/2021  OCT, Retina - OU - Both Eyes       Right Eye Quality was good. Central Foveal Thickness: 299. Progression has been stable. Findings include normal foveal contour, no IRF,  vitreomacular adhesion , subretinal fluid (Shallow SRF IT periphery caught on widefield; Bullous schisis ST periphery ).  Left Eye Quality was good. Central Foveal Thickness: 296. Progression has been stable. Findings include normal foveal contour, no IRF, vitreomacular adhesion , subretinal fluid (+Shallow SRF and schisis ST periphery caught on widefield - not imaged today).   Notes *Images captured and stored on drive  Diagnosis / Impression:  NFP; no IRF centrally OU OD: Shallow SRF IT periphery caught on widefield; Bullous schisis ST periphery  OS: +Shallow SRF within schisis cavity -- ST periphery -- caught on widefield - not imaged today  Clinical management:  See below  Abbreviations: NFP - Normal foveal profile. CME - cystoid macular edema. PED - pigment epithelial detachment. IRF - intraretinal fluid. SRF - subretinal fluid. EZ - ellipsoid zone. ERM - epiretinal membrane. ORA - outer retinal atrophy. ORT - outer retinal tubulation. SRHM - subretinal hyper-reflective material. 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 Bilateral retinal detachment  H33.23 OCT, Retina - OU - Both Eyes    3. Essential hypertension  I10     4. Hypertensive retinopathy of both eyes  H35.033     5. Combined forms of age-related cataract of both eyes  H25.813      1,2. 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          - OD: Shallow schisis and SRF IT (8871-9597) and bullous schisis ST periphery (1000-1130); - OS: Schisis detachment -- temporal schisis w/ ret hole and focal SRF @ 0300 -- good laser changes surrounding from (0100-0430) - s/p laser retinopexy OS (10.04.22) -- good laser changes in place - s/p laser retinopexy OD (10.25.22), touch-up (02.15.23) for schisis RD -- good laser changes in place from 0700-1130 -- good fill in laser from 0800-0900           - VA 20/20 OU, pt remains asymptomatic  - Reviewed s/s of RT/RD  - Strict return precautions for any such RT/RD signs/symptoms          - f/u 2-3 months, sooner prn,DFE, OCT  3,4. Hypertensive retinopathy OU - discussed importance of tight BP control - monitor   5. Mixed Cataract OU - The symptoms of cataract, surgical options, and treatments and risks were discussed with patient. - discussed diagnosis and progression - not yet visually significant - monitor for now  Ophthalmic Meds Ordered this visit:  No orders of the defined types were placed in this encounter.    Return for f/u 2-3 months, schisis OU, DFE, OCT.  There are no Patient Instructions on file for this visit.  Explained the diagnoses, plan, and follow up with the patient and they expressed understanding.  Patient expressed understanding of the importance of proper follow up care.   This document serves as a record of services personally performed by Gardiner Sleeper, MD, PhD. It was created on their behalf by Orvan Falconer, an ophthalmic technician. The creation of this record is the provider's dictation and/or activities during the visit.    Electronically signed by: Orvan Falconer, OA, 03/13/21  9:59 AM  This document serves as a record of services personally performed by Gardiner Sleeper, MD, PhD. It was created on their behalf by San Jetty. Owens Shark, OA an ophthalmic technician. The creation of this record is the provider's dictation and/or activities during the visit.    Electronically signed by: San Jetty. Owens Shark,  OA 03.01.2023 9:59 AM   Gardiner Sleeper, M.D., Ph.D. Diseases & Surgery of the Retina and Vitreous [facility]  I have reviewed the above documentation for accuracy and completeness, and I agree with the above. Gardiner Sleeper, M.D., Ph.D. 03/13/21 9:59 AM  Abbreviations: M myopia (nearsighted); A astigmatism; H hyperopia (farsighted); P presbyopia; Mrx spectacle  prescription;  CTL contact lenses; OD right eye; OS left eye; OU both eyes  XT exotropia; ET esotropia; PEK punctate epithelial keratitis; PEE punctate epithelial erosions; DES dry eye syndrome; MGD meibomian gland dysfunction; ATs artificial tears; PFAT's preservative free artificial tears; Allen nuclear sclerotic cataract; PSC posterior subcapsular cataract; ERM epi-retinal membrane; PVD posterior vitreous detachment; RD retinal detachment; DM diabetes mellitus; DR diabetic retinopathy; NPDR non-proliferative diabetic retinopathy; PDR proliferative diabetic retinopathy; CSME clinically significant macular edema; DME diabetic macular edema; dbh dot blot hemorrhages; CWS cotton wool spot; POAG primary open angle glaucoma; C/D cup-to-disc ratio; HVF humphrey visual field; GVF goldmann visual field; OCT optical coherence tomography; IOP intraocular pressure; BRVO Branch retinal vein occlusion; CRVO central retinal vein occlusion; CRAO central retinal artery occlusion; BRAO branch retinal artery occlusion; RT retinal tear; SB scleral buckle; PPV pars plana vitrectomy; VH Vitreous hemorrhage; PRP panretinal laser photocoagulation; IVK intravitreal kenalog; VMT vitreomacular traction; MH Macular hole;  NVD neovascularization of the disc; NVE neovascularization elsewhere; AREDS age related eye disease study; ARMD age related macular degeneration; POAG primary open angle glaucoma; EBMD epithelial/anterior basement membrane dystrophy; ACIOL anterior chamber intraocular lens; IOL intraocular lens; PCIOL posterior chamber intraocular lens; Phaco/IOL phacoemulsification with intraocular lens placement; [place] photorefractive keratectomy; LASIK laser assisted in situ keratomileusis; HTN hypertension; DM diabetes mellitus; COPD chronic obstructive pulmonary disease

## 2021-03-13 ENCOUNTER — Other Ambulatory Visit: Payer: Self-pay

## 2021-03-13 ENCOUNTER — Ambulatory Visit (INDEPENDENT_AMBULATORY_CARE_PROVIDER_SITE_OTHER): Payer: Medicare HMO | Admitting: Ophthalmology

## 2021-03-13 ENCOUNTER — Encounter (INDEPENDENT_AMBULATORY_CARE_PROVIDER_SITE_OTHER): Payer: Self-pay | Admitting: Ophthalmology

## 2021-03-13 DIAGNOSIS — H33103 Unspecified retinoschisis, bilateral: Secondary | ICD-10-CM

## 2021-03-13 DIAGNOSIS — H35033 Hypertensive retinopathy, bilateral: Secondary | ICD-10-CM | POA: Diagnosis not present

## 2021-03-13 DIAGNOSIS — H25813 Combined forms of age-related cataract, bilateral: Secondary | ICD-10-CM | POA: Diagnosis not present

## 2021-03-13 DIAGNOSIS — H3323 Serous retinal detachment, bilateral: Secondary | ICD-10-CM

## 2021-03-13 DIAGNOSIS — I10 Essential (primary) hypertension: Secondary | ICD-10-CM | POA: Diagnosis not present

## 2021-05-15 ENCOUNTER — Encounter: Payer: Self-pay | Admitting: Internal Medicine

## 2021-05-15 ENCOUNTER — Ambulatory Visit: Payer: Medicare HMO | Admitting: Internal Medicine

## 2021-05-15 VITALS — BP 126/72 | HR 65 | Ht 66.05 in | Wt 212.8 lb

## 2021-05-15 DIAGNOSIS — M791 Myalgia, unspecified site: Secondary | ICD-10-CM | POA: Diagnosis not present

## 2021-05-15 DIAGNOSIS — T466X5A Adverse effect of antihyperlipidemic and antiarteriosclerotic drugs, initial encounter: Secondary | ICD-10-CM | POA: Diagnosis not present

## 2021-05-15 DIAGNOSIS — E785 Hyperlipidemia, unspecified: Secondary | ICD-10-CM | POA: Diagnosis not present

## 2021-05-15 DIAGNOSIS — K760 Fatty (change of) liver, not elsewhere classified: Secondary | ICD-10-CM | POA: Diagnosis not present

## 2021-05-15 MED ORDER — PITAVASTATIN CALCIUM 2 MG PO TABS: 1.0000 | ORAL_TABLET | Freq: Every day | ORAL | 3 refills | Status: DC

## 2021-05-15 NOTE — Patient Instructions (Signed)
Medication Instructions:  ?START pitavastatin (livalo) '2mg'$  daily  ? ?*If you need a refill on your cardiac medications before your next appointment, please call your pharmacy* ? ? ?Lab Work: ?FASTING lab work to check cholesterol in 3-4 months  ?-- complete about 1 week before next appointment  ? ?If you have labs (blood work) drawn today and your tests are completely normal, you will receive your results only by: ?MyChart Message (if you have MyChart) OR ?A paper copy in the mail ?If you have any lab test that is abnormal or we need to change your treatment, we will call you to review the results. ? ? ?Testing/Procedures: ?NONE ? ? ?Follow-Up: ?At [facility], you and your health needs are our priority.  As part of our continuing mission to provide you with exceptional heart care, we have created designated Provider Care Teams.  These Care Teams include your primary Cardiologist (physician) and Advanced Practice Providers (APPs -  Physician Assistants and Nurse Practitioners) who all work together to provide you with the care you need, when you need it. ? ?We recommend signing up for the patient portal called "MyChart".  Sign up information is provided on this After Visit Summary.  MyChart is used to connect with patients for Virtual Visits (Telemedicine).  Patients are able to view lab/test results, encounter notes, upcoming appointments, etc.  Non-urgent messages can be sent to your provider as well.   ?To learn more about what you can do with MyChart, go to [email or web address].   ? ?Your next appointment:   ?3-4 months with Dr. Debara Pickett  ?-- [place] locations (lipid clinic) or [place] office  ?

## 2021-05-15 NOTE — Progress Notes (Signed)
? ? ?LIPID CLINIC CONSULT NOTE ? ?Chief Complaint:  ?Manage dyslipidemia ? ?Primary Care Physician: ?Kelton Pillar, MD ? ?Primary Cardiologist:  ?None ? ?HPI:  ?Heather Allison is a 71 y.o. female who is being seen today for the evaluation of dyslipidemia at the request of Collene Leyden, MD. this is a pleasant 72 year old female who with a history of dyslipidemia.  Unfortunately she has been statin intolerant.  She is tried 3 different statins in the past all of which caused significant side effects.  This included simvastatin, atorvastatin and rosuvastatin.  She also had tried gemfibrozil which caused her heart to race.  More recently she had repeat lipids which showed total cholesterol of 220, HDL 44, triglycerides 283 and LDL 127.  She has no known coronary disease but does have some aortic atherosclerosis.  There is a family history of heart disease in her father who had an MI in his 12s and bypass surgery afterwards and died in his 55s.  She reports fairly low-fat diet and gets intermittent exercise, typically when she goes to the beach she walks regularly but not when she is at home. ? ?PMHx:  ?Past Medical History:  ?Diagnosis Date  ? Cataract   ? Diverticulitis 09/2018  ? Hypertensive retinopathy   ? Hypothyroidism   ? PONV (postoperative nausea and vomiting)   ? Varicose veins   ? Wears glasses   ? ? ?Past Surgical History:  ?Procedure Laterality Date  ? ABDOMINAL HYSTERECTOMY    ? APPENDECTOMY  23009  ? lap appendix-rt adrenal mass  ? BACK SURGERY  01/2018  ? HERNIATED DISK  ? CHOLECYSTECTOMY  1997  ? COLONOSCOPY    ? FOOT SURGERY  2013  ? right-plantar   ? KNEE ARTHROSCOPY WITH MEDIAL MENISECTOMY Left 08/25/2013  ? Procedure: KNEE ARTHROSCOPY WITH DEBRIDEMENT/SHAVING (CHONDROPLASTY),WITH MENISCECTOMY MEDIAL;  Surgeon: Ninetta Lights, MD;  Location: [facility];  Service: Orthopedics;  Laterality: Left;  ? ovaries removed  2009  ? rt overian cyst-lt ovary out  ? RECTAL SURGERY    ? torn  tissue  ? ROTATOR CUFF REPAIR    ? ? ?FAMHx:  ?No family history on file. ? ?SOCHx:  ? reports that she quit smoking about 13 years ago. Her smoking use included cigarettes. She has never used smokeless tobacco. She reports that she does not drink alcohol and does not use drugs. ? ?ALLERGIES:  ?Allergies  ?Allergen Reactions  ? Doxycycline Nausea And Vomiting  ? Hydrocodone Nausea And Vomiting  ? Latex   ? Morphine And Related   ? Codeine Nausea And Vomiting  ? Erythromycin Nausea And Vomiting  ?  And rash  ? ? ?ROS: ?Pertinent items noted in HPI and remainder of comprehensive ROS otherwise negative. ? ?HOME MEDS: ?Current Outpatient Medications on File Prior to Visit  ?Medication Sig Dispense Refill  ? acetaminophen (TYLENOL) 325 MG tablet Take 2 tablets (650 mg total) by mouth every 6 (six) hours as needed for mild pain (or temp > 100).    ? Cholecalciferol 10 MCG (400 UNIT) CAPS Vitamin D3    ? fluticasone (FLONASE) 50 MCG/ACT nasal spray     ? levothyroxine (SYNTHROID) 112 MCG tablet Take 112 mcg by mouth every morning.    ? Misc Natural Products (ELDERBERRY/VITAMIN C/ZINC) CHEW Chew by mouth.    ? ?No current facility-administered medications on file prior to visit.  ? ? ?LABS/IMAGING: ?No results found for this or any previous visit (from the past 48 hour(s)). ?  No results found. ? ?LIPID PANEL: ?No results found for: CHOL, TRIG, HDL, CHOLHDL, VLDL, LDLCALC, LDLDIRECT ? ?WEIGHTS: ?Wt Readings from Last 3 Encounters:  ?05/15/21 212 lb 12.8 oz (96.5 kg)  ?09/23/18 216 lb 7.9 oz (98.2 kg)  ?08/25/13 216 lb 8 oz (98.2 kg)  ? ? ?VITALS: ?BP 126/72   Pulse 65   Ht 5' 6.05" (1.678 m)   Wt 212 lb 12.8 oz (96.5 kg)   SpO2 99%   BMI 34.29 kg/m?  ? ?EXAM: ?General appearance: alert and no distress ?Neck: no carotid bruit, no JVD, and thyroid not enlarged, symmetric, no tenderness/mass/nodules ?Lungs: clear to auscultation bilaterally ?Heart: regular rate and rhythm, S1, S2 normal, no murmur, click, rub or  gallop ?Abdomen: soft, non-tender; bowel sounds normal; no masses,  no organomegaly ?Extremities: extremities normal, atraumatic, no cyanosis or edema ?Pulses: 2+ and symmetric ?Skin: Skin color, texture, turgor normal. No rashes or lesions ?Neurologic: Grossly normal ?Psych: Pleasant ? ?*Examination chaperoned by Sheral Apley, RN. ? ?EKG: ?N/A ? 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 ?PLAN: ?1.   Ms. Riehle has a mixed dyslipidemia with a target LDL less than 100.  Although she has been statin intolerant, I do not think she qualify for PCSK9 inhibitor due to few if any cardiovascular risk factors other than heart disease in her father.  She has no history of diabetes, hypertension or other significant risk factors other than age and early onset heart disease in her family.  Although she cannot tolerate the statins, this alone would not necessary qualify her for PCSK9 inhibitor.  She also may not be a candidate for Nexletol.  We discussed this at length today.  We considered possibility of using ezetimibe or a statin.  She said she was willing to retrial a statin.  She does have a history of fatty liver with elevated liver enzymes.  We discussed trialing Livalo 2 mg daily which is less hepatically metabolized and should be easier on the liver and hopefully better tolerated.  Given her history, we will reassess her liver enzymes in about 2 weeks on therapy and plan repeat lipids in about 3 months.  Plan follow-up with me at that time. ? ?Thanks again for the kind referral. ? ?Pixie Casino, MD, [facility], FACP  ?[place]  ?Medical Director of the Advanced Lipid Disorders &  ?Cardiovascular Risk Reduction Clinic ?Diplomate of the [employer] of Clinical Lipidology ?Attending Cardiologist  ?Direct Dial: [phone]  Fax:  [phone]  ?Website:  [email or web address] ? ?Nadean Corwin Donato Studley ?05/15/2021, 4:30 PM ?

## 2021-05-17 MED ORDER — PITAVASTATIN MAGNESIUM 2 MG PO TABS: 1.0000 | ORAL_TABLET | Freq: Every day | ORAL | 11 refills | Status: DC

## 2021-05-29 DIAGNOSIS — L570 Actinic keratosis: Secondary | ICD-10-CM | POA: Diagnosis not present

## 2021-05-29 DIAGNOSIS — L821 Other seborrheic keratosis: Secondary | ICD-10-CM | POA: Diagnosis not present

## 2021-05-29 DIAGNOSIS — Z85828 Personal history of other malignant neoplasm of skin: Secondary | ICD-10-CM | POA: Diagnosis not present

## 2021-06-04 ENCOUNTER — Ambulatory Visit: Payer: Medicare HMO | Admitting: Internal Medicine

## 2021-06-11 ENCOUNTER — Encounter: Payer: Self-pay | Admitting: Internal Medicine

## 2021-06-11 ENCOUNTER — Telehealth: Payer: Self-pay | Admitting: Internal Medicine

## 2021-06-11 NOTE — Telephone Encounter (Signed)
Pt c/o medication issue:  1. Name of Medication: Pitavastatin Magnesium 2 MG TABS  2. How are you currently taking this medication (dosage and times per day)? As written   3. Are you having a reaction (difficulty breathing--STAT)? Yes   4. What is your medication issue? Iching all over, achy muscle, burning sensation in stomach; a little nausated   Wants to start trying a heart healthy diet. Send copy of one

## 2021-06-11 NOTE — Telephone Encounter (Signed)
Communicated with patient via MyChart message

## 2021-06-11 NOTE — Telephone Encounter (Signed)
She could just have her labs in August, I guess - will have to try and come up with an alternative  Dr Lemmie Evens

## 2021-06-11 NOTE — Telephone Encounter (Signed)
Left message to call back  

## 2021-06-24 ENCOUNTER — Encounter (INDEPENDENT_AMBULATORY_CARE_PROVIDER_SITE_OTHER): Payer: Medicare HMO | Admitting: Ophthalmology

## 2021-06-27 NOTE — Progress Notes (Signed)
[facility] Note  07/01/2021     CHIEF COMPLAINT Patient presents for Retina Follow Up    HISTORY OF PRESENT ILLNESS: Heather Allison is a 71 y.o. female who presents to the clinic today for:   HPI     Retina Follow Up   Patient presents with  Other.  In both eyes.  This started 3 months ago.  I, the attending physician,  performed the HPI with the patient and updated documentation appropriately.        Comments   Patient here for 3 months retina follow up for retinoschisis OU. Patient states vision doing good. On occasion has a quick sharp pain.       Last edited by Bernarda Caffey, MD on 07/02/2021  1:31 AM.      Referring physician: Kelton Pillar, MD [address] [place],  [place] [address]  HISTORICAL INFORMATION:   Selected notes from the MEDICAL RECORD NUMBER Referred by Dr. Cleda Mccreedy (Linna Hoff, [place]) for eval of retinoschisis OU   CURRENT MEDICATIONS: No current outpatient medications on file. (Ophthalmic Drugs)   No current facility-administered medications for this visit. (Ophthalmic Drugs)   Current Outpatient Medications (Other)  Medication Sig   acetaminophen (TYLENOL) 325 MG tablet Take 2 tablets (650 mg total) by mouth every 6 (six) hours as needed for mild pain (or temp > 100).   Cholecalciferol 10 MCG (400 UNIT) CAPS Vitamin D3   fluticasone (FLONASE) 50 MCG/ACT nasal spray    levothyroxine (SYNTHROID) 112 MCG tablet Take 112 mcg by mouth every morning.   Misc Natural Products (ELDERBERRY/VITAMIN C/ZINC) CHEW Chew by mouth.   Pitavastatin Magnesium 2 MG TABS Take 1 tablet by mouth daily.   No current facility-administered medications for this visit. (Other)   REVIEW OF SYSTEMS: ROS   Positive for: Eyes Negative for: Constitutional, Gastrointestinal, Neurological, Skin, Genitourinary, Musculoskeletal, HENT, Endocrine, Cardiovascular, Respiratory, Psychiatric, Allergic/Imm, Heme/Lymph Last edited  by Theodore Demark, COA on 07/01/2021  9:12 AM.     ALLERGIES Allergies  Allergen Reactions   Doxycycline Nausea And Vomiting   Hydrocodone Nausea And Vomiting   Latex    Morphine And Related    Codeine Nausea And Vomiting   Erythromycin Nausea And Vomiting    And rash   PAST MEDICAL HISTORY Past Medical History:  Diagnosis Date   Cataract    Diverticulitis 09/2018   Hypertensive retinopathy    Hypothyroidism    PONV (postoperative nausea and vomiting)    Varicose veins    Wears glasses    Past Surgical History:  Procedure Laterality Date   ABDOMINAL HYSTERECTOMY     APPENDECTOMY  23009   lap appendix-rt adrenal mass   BACK SURGERY  01/2018   HERNIATED DISK   CHOLECYSTECTOMY  1997   COLONOSCOPY     FOOT SURGERY  2013   right-plantar    KNEE ARTHROSCOPY WITH MEDIAL MENISECTOMY Left 08/25/2013   Procedure: KNEE ARTHROSCOPY WITH DEBRIDEMENT/SHAVING (CHONDROPLASTY),WITH MENISCECTOMY MEDIAL;  Surgeon: Ninetta Lights, MD;  Location: [place] [facility];  Service: Orthopedics;  Laterality: Left;   ovaries removed  2009   rt overian cyst-lt ovary out   RECTAL SURGERY     torn tissue   ROTATOR CUFF REPAIR      FAMILY HISTORY History reviewed. No pertinent family history.  SOCIAL HISTORY Social History   Tobacco Use   Smoking status: Former    Types: Cigarettes    Quit date:  01/14/2008    Years since quitting: 13.4   Smokeless tobacco: Never  Vaping Use   Vaping Use: Never used  Substance Use Topics   Alcohol use: No   Drug use: No       OPHTHALMIC EXAM: Base Eye Exam     Visual Acuity (Snellen - Linear)       Right Left   Dist cc 20/20 20/20    Correction: Glasses         Tonometry (Tonopen, 9:10 AM)       Right Left   Pressure 15 16         Pupils       Dark Light Shape React APD   Right 3 2 Round Brisk None   Left 3 2 Round Brisk None         Visual Fields (Counting fingers)       Left Right    Full Full          Extraocular Movement       Right Left    Full, Ortho Full, Ortho         Neuro/Psych     Oriented x3: Yes   Mood/Affect: Normal         Dilation     Both eyes: 1.0% Mydriacyl, 2.5% Phenylephrine @ 9:10 AM           Slit Lamp and Fundus Exam     Slit Lamp Exam       Right Left   Lids/Lashes Dermato Dermato   Conjunctiva/Sclera White and quiet White and quiet   Cornea 1+ PEE 1+ fine PEE   Anterior Chamber Deep and quiet Deep and quiet   Iris Round and dilated Round and dilated   Lens 2+ NS, 2+ cortical 2+ NS, 2+ cortical   Anterior Vitreous Synerisis Synerisis         Fundus Exam       Right Left   Disc Pink, sharp Pink. sharp   C/D Ratio 0.3 0.4   Macula Flat, good foveal reflex, mild RPE mottling, no heme or edema Flat, good foveal reflex, mild RPE mottling, no heme or edema   Vessels mild tortuosity Mild attenuation   Periphery Mild pavingstone inferiorly, scattered reticular degeneration, shallow schisis and +SRF  IT (0700-0900) and bullous schisis ST periphery (1000-1130) -- good laser surrounding all lesions from 0700 to 1130--good fill in changes at 0800-0900; no new RT/RD or schisis Temporal retinoschisis w/ret hole and focal SRF @ 0300 -- schisis detachment -- good laser changes surrounding from 0100-0430, no new RT/RD or schisis           Refraction     Wearing Rx       Sphere Cylinder Axis Add   Right +2.50 +1.50 174 +2.25   Left +2.00 +1.00 177 +2.25           IMAGING AND PROCEDURES  Imaging and Procedures for 07/01/2021  OCT, Retina - OU - Both Eyes       Right Eye Quality was good. Central Foveal Thickness: 301. Progression has been stable. Findings include normal foveal contour, no IRF, subretinal fluid, vitreomacular adhesion (Shallow SRF IT periphery caught on widefield; Bullous schisis ST periphery -- not imaged today ).   Left Eye Quality was good. Central Foveal Thickness: 298. Progression has been stable. Findings include  normal foveal contour, no IRF, subretinal fluid, vitreomacular adhesion (+Shallow SRF and schisis ST periphery caught on widefield).   Notes *Images  captured and stored on drive  Diagnosis / Impression:  NFP; no IRF/SRF centrally OU OD: Shallow SRF IT periphery caught on widefield; Bullous schisis ST periphery -- not imaged today OS: +Shallow SRF within schisis cavity -- ST periphery   Clinical management:  See below  Abbreviations: NFP - Normal foveal profile. CME - cystoid macular edema. PED - pigment epithelial detachment. IRF - intraretinal fluid. SRF - subretinal fluid. EZ - ellipsoid zone. ERM - epiretinal membrane. ORA - outer retinal atrophy. ORT - outer retinal tubulation. SRHM - subretinal hyper-reflective material. 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 Bilateral retinal detachment  H33.23     3. Essential hypertension  I10     4. Hypertensive retinopathy of both eyes  H35.033     5. Combined forms of age-related cataract of both eyes  H25.813       1,2. 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          - OD: Shallow schisis and SRF IT (2542-7062) and bullous schisis ST periphery (1000-1130)  - OS: Schisis detachment -- temporal schisis w/ ret hole and focal SRF @ 0300 -- good laser changes surrounding from (0100-0430) - s/p laser retinopexy OS (10.04.22) -- good laser changes in place - s/p laser retinopexy OD (10.25.22), touch-up (02.15.23) for schisis RD -- good laser changes in place from 0700-1130 -- good fill in laser from 0800-0900          - VA 20/20 OU, pt remains asymptomatic  - no new RT/RD or lattice  - Reviewed s/s of RT/RD  - Strict return precautions for any such RT/RD signs/symptoms          - f/u 6 months, sooner prn,DFE, OCT  3,4. Hypertensive retinopathy OU - discussed importance of tight BP control -  monitor  5. Mixed Cataract OU - The symptoms of cataract, surgical options, and treatments and risks were discussed with patient. - discussed diagnosis and progression - not yet visually significant - monitor for now  Ophthalmic Meds Ordered this visit:  No orders of the defined types were placed in this encounter.    Return in about 6 months (around 12/31/2021) for DFE, OCT.  There are no Patient Instructions on file for this visit.  Explained the diagnoses, plan, and follow up with the patient and they expressed understanding.  Patient expressed understanding of the importance of proper follow up care.   This document serves as a record of services personally performed by Gardiner Sleeper, MD, PhD. It was created on their behalf by Roselee Nova, COMT. The creation of this record is the provider's dictation and/or activities during the visit.  Electronically signed by: Roselee Nova, COMT 07/02/21 1:33 AM  This document serves as a record of services personally performed by Gardiner Sleeper, MD, PhD. It was created on their behalf by Leonie Douglas, an ophthalmic technician. The creation of this record is the provider's dictation and/or activities during the visit.    Electronically signed by: Leonie Douglas COA, 07/02/21  1:33 AM  Gardiner Sleeper, M.D., Ph.D. Diseases & Surgery of the Retina and Vitreous [facility]  I have reviewed the above documentation for accuracy and completeness, and I agree with the above. Gardiner Sleeper, M.D., Ph.D. 07/02/21 1:37 AM  Abbreviations: M myopia (nearsighted); A astigmatism; H hyperopia (farsighted); P presbyopia; Mrx spectacle prescription;  CTL contact lenses; OD right eye; OS left eye; OU both eyes  XT exotropia; ET esotropia; PEK punctate epithelial keratitis; PEE punctate epithelial erosions; DES dry eye syndrome; MGD meibomian gland dysfunction; ATs artificial tears; PFAT's preservative free artificial tears; [place] nuclear  sclerotic cataract; PSC posterior subcapsular cataract; ERM epi-retinal membrane; PVD posterior vitreous detachment; RD retinal detachment; DM diabetes mellitus; DR diabetic retinopathy; NPDR non-proliferative diabetic retinopathy; PDR proliferative diabetic retinopathy; CSME clinically significant macular edema; DME diabetic macular edema; dbh dot blot hemorrhages; CWS cotton wool spot; POAG primary open angle glaucoma; C/D cup-to-disc ratio; HVF humphrey visual field; GVF goldmann visual field; OCT optical coherence tomography; IOP intraocular pressure; BRVO Branch retinal vein occlusion; CRVO central retinal vein occlusion; CRAO central retinal artery occlusion; BRAO branch retinal artery occlusion; RT retinal tear; SB scleral buckle; PPV pars plana vitrectomy; VH Vitreous hemorrhage; PRP panretinal laser photocoagulation; IVK intravitreal kenalog; VMT vitreomacular traction; MH Macular hole;  NVD neovascularization of the disc; NVE neovascularization elsewhere; AREDS age related eye disease study; ARMD age related macular degeneration; POAG primary open angle glaucoma; EBMD epithelial/anterior basement membrane dystrophy; ACIOL anterior chamber intraocular lens; IOL intraocular lens; PCIOL posterior chamber intraocular lens; Phaco/IOL phacoemulsification with intraocular lens placement; [place] photorefractive keratectomy; LASIK laser assisted in situ keratomileusis; HTN hypertension; DM diabetes mellitus; COPD chronic obstructive pulmonary disease

## 2021-07-01 ENCOUNTER — Ambulatory Visit (INDEPENDENT_AMBULATORY_CARE_PROVIDER_SITE_OTHER): Payer: Medicare HMO | Admitting: Ophthalmology

## 2021-07-01 ENCOUNTER — Encounter (INDEPENDENT_AMBULATORY_CARE_PROVIDER_SITE_OTHER): Payer: Self-pay | Admitting: Ophthalmology

## 2021-07-01 DIAGNOSIS — H35033 Hypertensive retinopathy, bilateral: Secondary | ICD-10-CM | POA: Diagnosis not present

## 2021-07-01 DIAGNOSIS — H25813 Combined forms of age-related cataract, bilateral: Secondary | ICD-10-CM

## 2021-07-01 DIAGNOSIS — H33103 Unspecified retinoschisis, bilateral: Secondary | ICD-10-CM

## 2021-07-01 DIAGNOSIS — H3323 Serous retinal detachment, bilateral: Secondary | ICD-10-CM | POA: Diagnosis not present

## 2021-07-01 DIAGNOSIS — I10 Essential (primary) hypertension: Secondary | ICD-10-CM

## 2021-07-02 ENCOUNTER — Encounter (INDEPENDENT_AMBULATORY_CARE_PROVIDER_SITE_OTHER): Payer: Self-pay | Admitting: Ophthalmology

## 2021-07-18 DIAGNOSIS — R140 Abdominal distension (gaseous): Secondary | ICD-10-CM | POA: Diagnosis not present

## 2021-07-18 DIAGNOSIS — E782 Mixed hyperlipidemia: Secondary | ICD-10-CM | POA: Diagnosis not present

## 2021-07-18 DIAGNOSIS — E039 Hypothyroidism, unspecified: Secondary | ICD-10-CM | POA: Diagnosis not present

## 2021-07-18 DIAGNOSIS — Z79899 Other long term (current) drug therapy: Secondary | ICD-10-CM | POA: Diagnosis not present

## 2021-09-11 ENCOUNTER — Encounter: Payer: Self-pay | Admitting: Internal Medicine

## 2021-09-11 ENCOUNTER — Ambulatory Visit: Payer: Medicare HMO | Attending: Internal Medicine | Admitting: Internal Medicine

## 2021-09-11 VITALS — BP 128/70 | HR 66 | Ht 66.5 in | Wt 210.4 lb

## 2021-09-11 DIAGNOSIS — K760 Fatty (change of) liver, not elsewhere classified: Secondary | ICD-10-CM | POA: Diagnosis not present

## 2021-09-11 DIAGNOSIS — M791 Myalgia, unspecified site: Secondary | ICD-10-CM | POA: Diagnosis not present

## 2021-09-11 DIAGNOSIS — T466X5D Adverse effect of antihyperlipidemic and antiarteriosclerotic drugs, subsequent encounter: Secondary | ICD-10-CM | POA: Diagnosis not present

## 2021-09-11 DIAGNOSIS — E785 Hyperlipidemia, unspecified: Secondary | ICD-10-CM | POA: Diagnosis not present

## 2021-09-11 NOTE — Patient Instructions (Signed)
Medication Instructions:  NO CHANGES today -- will depend on calcium score results  *If you need a refill on your cardiac medications before your next appointment, please call your pharmacy*   Lab Work: FASTING lab work to check cholesterol in about 4 months -- complete about a week before next visit  If you have labs (blood work) drawn today and your tests are completely normal, you will receive your results only by: [place] (if you have MyChart) OR A paper copy in the mail If you have any lab test that is abnormal or we need to change your treatment, we will call you to review the results.   Testing/Procedures: Dr. Debara Pickett has ordered a CT coronary calcium score.   Test locations:  [place]   This is $99 out of pocket.   Coronary CalciumScan A coronary calcium scan is an imaging test used to look for deposits of calcium and other fatty materials (plaques) in the inner lining of the blood vessels of the heart (coronary arteries). These deposits of calcium and plaques can partly clog and narrow the coronary arteries without producing any symptoms or warning signs. This puts a person at risk for a heart attack. This test can detect these deposits before symptoms develop. Tell a health care provider about: Any allergies you have. All medicines you are taking, including vitamins, herbs, eye drops, creams, and over-the-counter medicines. Any problems you or family members have had with anesthetic medicines. Any blood disorders you have. Any surgeries you have had. Any medical conditions you have. Whether you are pregnant or may be pregnant. What are the risks? Generally, this is a safe procedure. However, problems may occur, including: Harm to a pregnant woman and her unborn baby. This test involves the use of radiation. Radiation exposure can be dangerous to a pregnant woman and her unborn baby. If you are pregnant, you generally should not  have this procedure done. Slight increase in the risk of cancer. This is because of the radiation involved in the test. What happens before the procedure? No preparation is needed for this procedure. What happens during the procedure? You will undress and remove any jewelry around your neck or chest. You will put on a hospital gown. Sticky electrodes will be placed on your chest. The electrodes will be connected to an electrocardiogram (ECG) machine to record a tracing of the electrical activity of your heart. A CT scanner will take pictures of your heart. During this time, you will be asked to lie still and hold your breath for 2-3 seconds while a picture of your heart is being taken. The procedure may vary among health care providers and hospitals. What happens after the procedure? You can get dressed. You can return to your normal activities. It is up to you to get the results of your test. Ask your health care provider, or the department that is doing the test, when your results will be ready. Summary A coronary calcium scan is an imaging test used to look for deposits of calcium and other fatty materials (plaques) in the inner lining of the blood vessels of the heart (coronary arteries). Generally, this is a safe procedure. Tell your health care provider if you are pregnant or may be pregnant. No preparation is needed for this procedure. A CT scanner will take pictures of your heart. You can return to your normal activities after the scan is done. This information is not intended to replace advice given to you  by your health care provider. Make sure you discuss any questions you have with your health care provider. Document Released: 06/28/2007 Document Revised: 11/19/2015 Document Reviewed: 11/19/2015 Elsevier Interactive Patient Education  2017 [place]: At [facility], you and your health needs are our priority.  As part of our continuing mission to  provide you with exceptional heart care, we have created designated Provider Care Teams.  These Care Teams include your primary Cardiologist (physician) and Advanced Practice Providers (APPs -  Physician Assistants and Nurse Practitioners) who all work together to provide you with the care you need, when you need it.  We recommend signing up for the patient portal called "MyChart".  Sign up information is provided on this After Visit Summary.  MyChart is used to connect with patients for Virtual Visits (Telemedicine).  Patients are able to view lab/test results, encounter notes, upcoming appointments, etc.  Non-urgent messages can be sent to your provider as well.   To learn more about what you can do with MyChart, go to [email or web address].    Your next appointment:   4 month(s)  The format for your next appointment:   In Person  Provider:   Dr. Debara Pickett

## 2021-09-11 NOTE — Progress Notes (Signed)
LIPID CLINIC CONSULT NOTE  Chief Complaint:  Manage dyslipidemia  Primary Care Physician: Kelton Pillar, MD  Primary Cardiologist:  None  HPI:  Heather Allison is a 71 y.o. female who is being seen today for the evaluation of dyslipidemia at the request of Kelton Pillar, MD. this is a pleasant 71 year old female who with a history of dyslipidemia.  Unfortunately she has been statin intolerant.  She is tried 3 different statins in the past all of which caused significant side effects.  This included simvastatin, atorvastatin and rosuvastatin.  She also had tried gemfibrozil which caused her heart to race.  More recently she had repeat lipids which showed total cholesterol of 220, HDL 44, triglycerides 283 and LDL 127.  She has no known coronary disease but does have some aortic atherosclerosis.  There is a family history of heart disease in her father who had an MI in his 32s and bypass surgery afterwards and died in his 72s.  She reports fairly low-fat diet and gets intermittent exercise, typically when she goes to the beach she walks regularly but not when she is at home.  09/11/2021  [name] returns today for follow-up.  Unfortunately she was not able to tolerate [place].  At this point I would consider her statin intolerant.  Her last lipids recently showed total cholesterol 215, triglycerides 190, HDL 47 and LDL 134.  She does have some aortic atherosclerosis but no known cardiovascular disease.  I do think we need to conceptualize this better to determine her targets of therapy.  PMHx:  Past Medical History:  Diagnosis Date   Cataract    Diverticulitis 09/2018   Hypertensive retinopathy    Hypothyroidism    PONV (postoperative nausea and vomiting)    Varicose veins    Wears glasses     Past Surgical History:  Procedure Laterality Date   ABDOMINAL HYSTERECTOMY     APPENDECTOMY  23009   lap appendix-rt adrenal mass   BACK SURGERY  01/2018   HERNIATED DISK    CHOLECYSTECTOMY  1997   COLONOSCOPY     FOOT SURGERY  2013   right-plantar    KNEE ARTHROSCOPY WITH MEDIAL MENISECTOMY Left 08/25/2013   Procedure: KNEE ARTHROSCOPY WITH DEBRIDEMENT/SHAVING (CHONDROPLASTY),WITH MENISCECTOMY MEDIAL;  Surgeon: Ninetta Lights, MD;  Location: [facility];  Service: Orthopedics;  Laterality: Left;   ovaries removed  2009   rt overian cyst-lt ovary out   RECTAL SURGERY     torn tissue   ROTATOR CUFF REPAIR      FAMHx:  No family history on file.  SOCHx:   reports that she quit smoking about 13 years ago. Her smoking use included cigarettes. She has never used smokeless tobacco. She reports that she does not drink alcohol and does not use drugs.  ALLERGIES:  Allergies  Allergen Reactions   Doxycycline Nausea And Vomiting   Hydrocodone Nausea And Vomiting   Latex    Morphine And Related    Codeine Nausea And Vomiting   Erythromycin Nausea And Vomiting    And rash    ROS: Pertinent items noted in HPI and remainder of comprehensive ROS otherwise negative.  HOME MEDS: Current Outpatient Medications on File Prior to Visit  Medication Sig Dispense Refill   acetaminophen (TYLENOL) 325 MG tablet Take 2 tablets (650 mg total) by mouth every 6 (six) hours as needed for mild pain (or temp > 100).     Cholecalciferol 10 MCG (400 UNIT) CAPS Vitamin  D3     fluticasone (FLONASE) 50 MCG/ACT nasal spray as needed.     levothyroxine (SYNTHROID) 112 MCG tablet Take 112 mcg by mouth every morning.     Misc Natural Products (ELDERBERRY/VITAMIN C/ZINC) CHEW Chew by mouth.     No current facility-administered medications on file prior to visit.    LABS/IMAGING: No results found for this or any previous visit (from the past 48 hour(s)). No results found.  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   Ms. Lasecki is statin intolerant and we will have to investigate other possible options for therapy.  Based on her family history I am concerned that there could be early onset heart disease.  I like to get a coronary calcium score.  If her calcium score significantly elevated, would consider pursuing PCSK9 inhibitor therapy since she cannot tolerate statins.  Alternatively if this was not authorized, then would likely recommend ezetimibe or other nonstatin therapy.  She is agreeable to this approach. Follow-up with me in about 3 to 4 months.  Pixie Casino, MD, [facility], [place] Director of the Advanced Lipid Disorders &  Cardiovascular Risk Reduction Clinic Diplomate of the American Board of Clinical Lipidology Attending Cardiologist  Direct Dial: [phone]  Fax: [phone]  Website:  [email or web address] 09/11/2021, 3:40 PM

## 2021-10-01 DIAGNOSIS — M25562 Pain in left knee: Secondary | ICD-10-CM | POA: Diagnosis not present

## 2021-10-01 DIAGNOSIS — M545 Low back pain, unspecified: Secondary | ICD-10-CM | POA: Diagnosis not present

## 2021-10-11 DIAGNOSIS — M5116 Intervertebral disc disorders with radiculopathy, lumbar region: Secondary | ICD-10-CM | POA: Diagnosis not present

## 2021-10-11 DIAGNOSIS — M9953 Intervertebral disc stenosis of neural canal of lumbar region: Secondary | ICD-10-CM | POA: Diagnosis not present

## 2021-10-17 ENCOUNTER — Ambulatory Visit (HOSPITAL_COMMUNITY)
Admission: RE | Admit: 2021-10-17 | Discharge: 2021-10-17 | Disposition: A | Discharge status: Home / Self Care | Payer: Medicare HMO | Source: Ambulatory Visit | Attending: Internal Medicine | Admitting: Internal Medicine

## 2021-10-17 DIAGNOSIS — E785 Hyperlipidemia, unspecified: Secondary | ICD-10-CM | POA: Insufficient documentation

## 2021-10-29 DIAGNOSIS — M5116 Intervertebral disc disorders with radiculopathy, lumbar region: Secondary | ICD-10-CM | POA: Diagnosis not present

## 2021-10-31 ENCOUNTER — Other Ambulatory Visit: Payer: Self-pay | Admitting: Internal Medicine

## 2021-10-31 DIAGNOSIS — R911 Solitary pulmonary nodule: Secondary | ICD-10-CM

## 2021-11-26 DIAGNOSIS — M5116 Intervertebral disc disorders with radiculopathy, lumbar region: Secondary | ICD-10-CM | POA: Diagnosis not present

## 2021-12-14 DIAGNOSIS — J0140 Acute pansinusitis, unspecified: Secondary | ICD-10-CM | POA: Diagnosis not present

## 2021-12-17 NOTE — Progress Notes (Signed)
[facility] Note  12/31/2021    CHIEF COMPLAINT Patient presents for Retina Follow Up   HISTORY OF PRESENT ILLNESS: Heather Allison is a 71 y.o. female who presents to the clinic today for:   HPI     Retina Follow Up   Patient presents with  Other.  In both eyes.  This started 6 months ago.  Duration of 6 months.  I, the attending physician,  performed the HPI with the patient and updated documentation appropriately.        Comments   6 month retina follow up retinoschisis pt states no vision changes noticed and denies any flashes or floaters       Last edited by Bernarda Caffey, MD on 12/31/2021 12:27 PM.    Pt states vision is stable, she is using Systane drops  Referring physician: Madelin Headings, DO [address] [place],  [place] [address]  HISTORICAL INFORMATION:   Selected notes from the [facility] Referred by Dr. Cleda Mccreedy ([place], [place]) for eval of retinoschisis OU   CURRENT MEDICATIONS: No current outpatient medications on file. (Ophthalmic Drugs)   No current facility-administered medications for this visit. (Ophthalmic Drugs)   Current Outpatient Medications (Other)  Medication Sig   acetaminophen (TYLENOL) 325 MG tablet Take 2 tablets (650 mg total) by mouth every 6 (six) hours as needed for mild pain (or temp > 100).   Cholecalciferol 10 MCG (400 UNIT) CAPS Vitamin D3   fluticasone (FLONASE) 50 MCG/ACT nasal spray as needed.   levothyroxine (SYNTHROID) 112 MCG tablet Take 112 mcg by mouth every morning.   Misc Natural Products (ELDERBERRY/VITAMIN C/ZINC) CHEW Chew by mouth.   No current facility-administered medications for this visit. (Other)   REVIEW OF SYSTEMS: ROS   Positive for: Eyes Negative for: Constitutional, Gastrointestinal, Neurological, Skin, Genitourinary, Musculoskeletal, HENT, Endocrine, Cardiovascular, Respiratory, Psychiatric, Allergic/Imm, Heme/Lymph Last edited by Bernarda Caffey, MD  on 12/31/2021 12:24 PM.     ALLERGIES Allergies  Allergen Reactions   Doxycycline Nausea And Vomiting   Hydrocodone Nausea And Vomiting   Latex    Morphine And Related    Codeine Nausea And Vomiting   Erythromycin Nausea And Vomiting    And rash   PAST MEDICAL HISTORY Past Medical History:  Diagnosis Date   Cataract    Diverticulitis 09/2018   Hypertensive retinopathy    Hypothyroidism    PONV (postoperative nausea and vomiting)    Varicose veins    Wears glasses    Past Surgical History:  Procedure Laterality Date   ABDOMINAL HYSTERECTOMY     APPENDECTOMY  23009   lap appendix-rt adrenal mass   BACK SURGERY  01/2018   HERNIATED DISK   CHOLECYSTECTOMY  1997   COLONOSCOPY     FOOT SURGERY  2013   right-plantar    KNEE ARTHROSCOPY WITH MEDIAL MENISECTOMY Left 08/25/2013   Procedure: KNEE ARTHROSCOPY WITH DEBRIDEMENT/SHAVING (CHONDROPLASTY),WITH MENISCECTOMY MEDIAL;  Surgeon: Ninetta Lights, MD;  Location: [facility];  Service: Orthopedics;  Laterality: Left;   ovaries removed  2009   rt overian cyst-lt ovary out   RECTAL SURGERY     torn tissue   ROTATOR CUFF REPAIR     FAMILY HISTORY History reviewed. No pertinent family history.  SOCIAL HISTORY Social History   Tobacco Use   Smoking status: Former    Types: Cigarettes    Quit date: 01/14/2008    Years since quitting: 13.9   Smokeless tobacco:  Never  Vaping Use   Vaping Use: Never used  Substance Use Topics   Alcohol use: No   Drug use: No       OPHTHALMIC EXAM: Base Eye Exam     Visual Acuity (Snellen - Linear)       Right Left   Dist cc 20/25 20/20 -1    Correction: Glasses         Tonometry (Tonopen, 8:54 AM)       Right Left   Pressure 16 13         Pupils       Pupils Dark Light Shape React APD   Right PERRL 3 2 Round Brisk None   Left PERRL 3 2 Round Brisk None         Visual Fields       Left Right    Full Full         Extraocular Movement        Right Left    Full, Ortho Full, Ortho         Neuro/Psych     Oriented x3: Yes   Mood/Affect: Normal         Dilation     Both eyes: 2.5% Phenylephrine @ 8:54 AM           Slit Lamp and Fundus Exam     Slit Lamp Exam       Right Left   Lids/Lashes Dermato, mild MGD Dermato, mild MGD   Conjunctiva/Sclera White and quiet White and quiet   Cornea 1+ PEE, mild tear film debris 1+ PEE, tear film debris   Anterior Chamber deep, clear, narrow temporal angle deep, clear, narrow temporal angle   Iris Round and dilated Round and dilated   Lens 2+ NS, 2-3+ cortical 2+ NS, 2-3+ cortical   Anterior Vitreous Syneresis Syneresis         Fundus Exam       Right Left   Disc Pink, sharp, mild PPA Pink. sharp, mild PPA   C/D Ratio 0.3 0.5   Macula Flat, good foveal reflex, mild RPE mottling, no heme or edema Flat, good foveal reflex, fine drusen, mild RPE mottling, no heme or edema   Vessels mild attenuation, mild tortuosity Mild attenuation, mild tortuosity, mild copper wiring   Periphery Attached, mild pavingstone inferiorly, scattered reticular degeneration, mild peripheral drusen, shallow schisis and +SRF  IT (0700-0900) and bullous schisis ST periphery (1000-1130) -- good laser surrounding all lesions from 0700 to 1130--good fill in changes at 0800-0900; no new RT/RD or schisis Attached, temporal retinoschisis w/ret hole and focal SRF @ 0300 -- schisis detachment -- good laser changes surrounding from 0100-0430, no new RT/RD or schisis, mild peripheral drusen, no heme           Refraction     Wearing Rx       Sphere Cylinder Axis Add   Right +2.50 +1.50 174 +2.25   Left +2.00 +1.00 177 +2.25           IMAGING AND PROCEDURES  Imaging and Procedures for 12/31/2021  OCT, Retina - OU - Both Eyes       Right Eye Quality was good. Central Foveal Thickness: 302. Progression has been stable. Findings include normal foveal contour, no IRF, subretinal fluid,  vitreomacular adhesion (Shallow SRF IT periphery caught on widefield; Bullous schisis ST periphery).   Left Eye Quality was good. Central Foveal Thickness: 296. Progression has been stable. Findings include normal foveal  contour, no IRF, subretinal fluid, vitreomacular adhesion (+Shallow SRF and schisis ST periphery caught on widefield).   Notes *Images captured and stored on drive  Diagnosis / Impression:  NFP; no IRF/SRF centrally OU OD: Shallow SRF IT periphery caught on widefield; Bullous schisis ST periphery -- stable OS: Shallow SRF within schisis cavity -- ST periphery -- stable  Clinical management:  See below  Abbreviations: NFP - Normal foveal profile. CME - cystoid macular edema. PED - pigment epithelial detachment. IRF - intraretinal fluid. SRF - subretinal fluid. EZ - ellipsoid zone. ERM - epiretinal membrane. ORA - outer retinal atrophy. ORT - outer retinal tubulation. SRHM - subretinal hyper-reflective material. 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 Bilateral retinal detachment  H33.23 OCT, Retina - OU - Both Eyes    3. Essential hypertension  I10     4. Hypertensive retinopathy of both eyes  H35.033     5. Combined forms of age-related cataract of both eyes  H25.813      1,2. 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          - OD: Shallow schisis and SRF IT (2263-3354) and bullous schisis ST periphery (1000-1130)  - OS: Schisis detachment -- temporal schisis w/ ret hole and focal SRF @ 0300 -- good laser changes surrounding from (0100-0430) - s/p laser retinopexy OS (10.04.22) -- good laser changes in place - s/p laser retinopexy OD (10.25.22), touch-up (02.15.23) for schisis RD -- good laser changes in place from 0700-1130 -- good fill in laser from 0800-0900          - VA OD: 20/25, OS: 20/20, pt remains asymptomatic  -  no new RT/RD or lattice  - Reviewed s/s of RT/RD  - Strict return precautions for any such RT/RD signs/symptoms          - f/u 6-9 months, sooner prn,DFE, OCT  3,4. Hypertensive retinopathy OU - discussed importance of tight BP control - monitor  5. Mixed Cataract OU - The symptoms of cataract, surgical options, and treatments and risks were discussed with patient. - discussed diagnosis and progression  - mild progression from prior  - monitor  Ophthalmic Meds Ordered this visit:  No orders of the defined types were placed in this encounter.    Return for f/u 6-9 months, schisis OU, DFE, OCT.  There are no Patient Instructions on file for this visit.  Explained the diagnoses, plan, and follow up with the patient and they expressed understanding.  Patient expressed understanding of the importance of proper follow up care.   This document serves as a record of services personally performed by Gardiner Sleeper, MD, PhD. It was created on their behalf by Renaldo Reel, [place] an ophthalmic technician. The creation of this record is the provider's dictation and/or activities during the visit.    Electronically signed by:  Renaldo Reel, COT  12.05.23 12:33 PM  This document serves as a record of services personally performed by Gardiner Sleeper, MD, PhD. It was created on their behalf by San Jetty. Owens Shark, OA an ophthalmic technician. The creation of this record is the provider's dictation and/or activities during the visit.    Electronically signed by: San Jetty. Marguerita Merles 12.19.2023 12:33 PM  Gardiner Sleeper, M.D., Ph.D. Diseases & Surgery of the Retina  and Vitreous [facility]  I have reviewed the above documentation for accuracy and completeness, and I agree with the above. Gardiner Sleeper, M.D., Ph.D. 12/31/21 12:33 PM   Abbreviations: M myopia (nearsighted); A astigmatism; H hyperopia (farsighted); P presbyopia; Mrx spectacle prescription;  CTL  contact lenses; OD right eye; OS left eye; OU both eyes  XT exotropia; ET esotropia; PEK punctate epithelial keratitis; PEE punctate epithelial erosions; DES dry eye syndrome; MGD meibomian gland dysfunction; ATs artificial tears; PFAT's preservative free artificial tears; [place] nuclear sclerotic cataract; PSC posterior subcapsular cataract; ERM epi-retinal membrane; PVD posterior vitreous detachment; RD retinal detachment; DM diabetes mellitus; DR diabetic retinopathy; NPDR non-proliferative diabetic retinopathy; PDR proliferative diabetic retinopathy; CSME clinically significant macular edema; DME diabetic macular edema; dbh dot blot hemorrhages; CWS cotton wool spot; POAG primary open angle glaucoma; C/D cup-to-disc ratio; HVF humphrey visual field; GVF goldmann visual field; OCT optical coherence tomography; IOP intraocular pressure; BRVO Branch retinal vein occlusion; CRVO central retinal vein occlusion; CRAO central retinal artery occlusion; BRAO branch retinal artery occlusion; RT retinal tear; SB scleral buckle; PPV pars plana vitrectomy; VH Vitreous hemorrhage; PRP panretinal laser photocoagulation; IVK intravitreal kenalog; VMT vitreomacular traction; MH Macular hole;  NVD neovascularization of the disc; NVE neovascularization elsewhere; AREDS age related eye disease study; ARMD age related macular degeneration; POAG primary open angle glaucoma; EBMD epithelial/anterior basement membrane dystrophy; ACIOL anterior chamber intraocular lens; IOL intraocular lens; PCIOL posterior chamber intraocular lens; Phaco/IOL phacoemulsification with intraocular lens placement; [place] photorefractive keratectomy; LASIK laser assisted in situ keratomileusis; HTN hypertension; DM diabetes mellitus; COPD chronic obstructive pulmonary disease

## 2021-12-31 ENCOUNTER — Ambulatory Visit (INDEPENDENT_AMBULATORY_CARE_PROVIDER_SITE_OTHER): Payer: Medicare HMO | Admitting: Ophthalmology

## 2021-12-31 ENCOUNTER — Encounter (INDEPENDENT_AMBULATORY_CARE_PROVIDER_SITE_OTHER): Payer: Self-pay | Admitting: Ophthalmology

## 2021-12-31 DIAGNOSIS — H3323 Serous retinal detachment, bilateral: Secondary | ICD-10-CM | POA: Diagnosis not present

## 2021-12-31 DIAGNOSIS — H35033 Hypertensive retinopathy, bilateral: Secondary | ICD-10-CM | POA: Diagnosis not present

## 2021-12-31 DIAGNOSIS — H33103 Unspecified retinoschisis, bilateral: Secondary | ICD-10-CM

## 2021-12-31 DIAGNOSIS — I10 Essential (primary) hypertension: Secondary | ICD-10-CM

## 2021-12-31 DIAGNOSIS — H25813 Combined forms of age-related cataract, bilateral: Secondary | ICD-10-CM | POA: Diagnosis not present

## 2022-01-02 ENCOUNTER — Encounter: Payer: Self-pay | Admitting: Internal Medicine

## 2022-01-02 ENCOUNTER — Ambulatory Visit: Payer: Medicare HMO | Attending: Internal Medicine | Admitting: Internal Medicine

## 2022-01-02 VITALS — BP 162/84 | HR 71 | Ht 66.0 in | Wt 207.6 lb

## 2022-01-02 DIAGNOSIS — M791 Myalgia, unspecified site: Secondary | ICD-10-CM

## 2022-01-02 DIAGNOSIS — K30 Functional dyspepsia: Secondary | ICD-10-CM

## 2022-01-02 DIAGNOSIS — T466X5D Adverse effect of antihyperlipidemic and antiarteriosclerotic drugs, subsequent encounter: Secondary | ICD-10-CM | POA: Diagnosis not present

## 2022-01-02 DIAGNOSIS — T466X5A Adverse effect of antihyperlipidemic and antiarteriosclerotic drugs, initial encounter: Secondary | ICD-10-CM

## 2022-01-02 DIAGNOSIS — K2289 Other specified disease of esophagus: Secondary | ICD-10-CM | POA: Diagnosis not present

## 2022-01-02 DIAGNOSIS — E785 Hyperlipidemia, unspecified: Secondary | ICD-10-CM | POA: Diagnosis not present

## 2022-01-02 DIAGNOSIS — R911 Solitary pulmonary nodule: Secondary | ICD-10-CM | POA: Diagnosis not present

## 2022-01-02 DIAGNOSIS — K760 Fatty (change of) liver, not elsewhere classified: Secondary | ICD-10-CM | POA: Diagnosis not present

## 2022-01-02 MED ORDER — EZETIMIBE 10 MG PO TABS: 10.0000 mg | ORAL_TABLET | Freq: Every day | ORAL | 3 refills | Status: DC

## 2022-01-02 NOTE — Progress Notes (Signed)
LIPID CLINIC CONSULT NOTE  Chief Complaint:  Follow-up dyslipidemia  Primary Care Physician: Kelton Pillar, MD  Primary Cardiologist:  None  HPI:  Heather Allison is a 71 y.o. female who is being seen today for the evaluation of dyslipidemia at the request of Kelton Pillar, MD. this is a pleasant 71 year old female who with a history of dyslipidemia.  Unfortunately she has been statin intolerant.  She is tried 3 different statins in the past all of which caused significant side effects.  This included simvastatin, atorvastatin and rosuvastatin.  She also had tried gemfibrozil which caused her heart to race.  More recently she had repeat lipids which showed total cholesterol of 220, HDL 44, triglycerides 283 and LDL 127.  She has no known coronary disease but does have some aortic atherosclerosis.  There is a family history of heart disease in her father who had an MI in his 31s and bypass surgery afterwards and died in his 50s.  She reports fairly low-fat diet and gets intermittent exercise, typically when she goes to the beach she walks regularly but not when she is at home.  09/11/2021  Heather Allison returns today for follow-up.  Unfortunately she was not able to tolerate [place].  At this point I would consider her statin intolerant.  Her last lipids recently showed total cholesterol 215, triglycerides 190, HDL 47 and LDL 134.  She does have some aortic atherosclerosis but no known cardiovascular disease.  I do think we need to conceptualize this better to determine her targets of therapy.  01/02/2022  Heather Allison returns today for follow-up.  She underwent coronary calcium scoring which showed an elevated calcium score of 36.5, 62nd percentile for age and sex matched controls.  This was predominantly in the LAD.  There were however some extracardiac findings including a suspicious 7 mm left solid pulmonary nodule within the upper lobe.  A repeat CT scan was recommended to rule out  possible malignancy.  We have already ordered this to be repeated in 6 months.  Additionally there was distal esophageal thickening concerning for possible esophagitis or infection.  I would advise GI evaluation of this.  Aortic atherosclerosis was noted.  PMHx:  Past Medical History:  Diagnosis Date   Cataract    Diverticulitis 09/2018   Hypertensive retinopathy    Hypothyroidism    PONV (postoperative nausea and vomiting)    Varicose veins    Wears glasses     Past Surgical History:  Procedure Laterality Date   ABDOMINAL HYSTERECTOMY     APPENDECTOMY  23009   lap appendix-rt adrenal mass   BACK SURGERY  01/2018   HERNIATED DISK   CHOLECYSTECTOMY  1997   COLONOSCOPY     FOOT SURGERY  2013   right-plantar    KNEE ARTHROSCOPY WITH MEDIAL MENISECTOMY Left 08/25/2013   Procedure: KNEE ARTHROSCOPY WITH DEBRIDEMENT/SHAVING (CHONDROPLASTY),WITH MENISCECTOMY MEDIAL;  Surgeon: Ninetta Lights, MD;  Location: [facility];  Service: Orthopedics;  Laterality: Left;   ovaries removed  2009   rt overian cyst-lt ovary out   RECTAL SURGERY     torn tissue   ROTATOR CUFF REPAIR      FAMHx:  No family history on file.  SOCHx:   reports that she quit smoking about 13 years ago. Her smoking use included cigarettes. She has never used smokeless tobacco. She reports that she does not drink alcohol and does not use drugs.  ALLERGIES:  Allergies  Allergen Reactions  Doxycycline Nausea And Vomiting   Hydrocodone Nausea And Vomiting   Latex    Morphine And Related    Codeine Nausea And Vomiting   Erythromycin Nausea And Vomiting    And rash    ROS: Pertinent items noted in HPI and remainder of comprehensive ROS otherwise negative.  HOME MEDS: Current Outpatient Medications on File Prior to Visit  Medication Sig Dispense Refill   acetaminophen (TYLENOL) 325 MG tablet Take 2 tablets (650 mg total) by mouth every 6 (six) hours as needed for mild pain (or temp > 100).      fluticasone (FLONASE) 50 MCG/ACT nasal spray as needed.     levothyroxine (SYNTHROID) 112 MCG tablet Take 112 mcg by mouth every morning.     PROBIOTIC PRODUCT PO Take by mouth.     Misc Natural Products (ELDERBERRY/VITAMIN C/ZINC) CHEW Chew by mouth. (Patient not taking: Reported on 01/02/2022)     No current facility-administered medications on file prior to visit.    LABS/IMAGING: No results found for this or any previous visit (from the past 48 hour(s)). No results found.  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   Heather Allison has had elevated cholesterol, not at target.  Given her coronary calcification I would advise trying to reach a target LDL less than 70.  Will plan to start ezetimibe 10 mg daily as she is statin intolerant.  Will repeat labs including an NMR and LP(a) as well as liver enzymes given her history of issues with that.  I have referred her to Dr. Benson Norway for evaluation of her distal esophageal thickening.  It also sounds like she has had some abdominal discomfort and possibly reflux symptoms.  Finally, as mentioned we have repeated her CT scan which will be in 6 months to follow-up on her lung nodule.  Pixie Casino, MD, [facility], [place] Director of the Advanced Lipid Disorders &  Cardiovascular Risk  Reduction Clinic Diplomate of the American Board of Clinical Lipidology Attending Cardiologist  Direct Dial: [phone]  Fax: [phone]  Website:  [email or web address] 01/02/2022, 11:00 AM

## 2022-01-02 NOTE — Patient Instructions (Signed)
Medication Instructions:  START zetia '10mg'$  daily   *If you need a refill on your cardiac medications before your next appointment, please call your pharmacy*   Lab Work: Non-Fasting liver function test in 2-4 weeks   Fasting lab work to check cholesterol in about 3-4 months  If you have labs (blood work) drawn today and your tests are completely normal, you will receive your results only by: [place] (if you have MyChart) OR A paper copy in the mail If you have any lab test that is abnormal or we need to change your treatment, we will call you to review the results.   Testing/Procedures: Chest CT in April 2024   Follow-Up: At [facility], you and your health needs are our priority.  As part of our continuing mission to provide you with exceptional heart care, we have created designated Provider Care Teams.  These Care Teams include your primary Cardiologist (physician) and Advanced Practice Providers (APPs -  Physician Assistants and Nurse Practitioners) who all work together to provide you with the care you need, when you need it.  We recommend signing up for the patient portal called "MyChart".  Sign up information is provided on this After Visit Summary.  MyChart is used to connect with patients for Virtual Visits (Telemedicine).  Patients are able to view lab/test results, encounter notes, upcoming appointments, etc.  Non-urgent messages can be sent to your provider as well.   To learn more about what you can do with MyChart, go to [email or web address].    Your next appointment:    April 2024 -- after chest CT

## 2022-01-07 DIAGNOSIS — J069 Acute upper respiratory infection, unspecified: Secondary | ICD-10-CM | POA: Diagnosis not present

## 2022-02-05 DIAGNOSIS — K2289 Other specified disease of esophagus: Secondary | ICD-10-CM | POA: Diagnosis not present

## 2022-02-05 DIAGNOSIS — I700 Atherosclerosis of aorta: Secondary | ICD-10-CM | POA: Diagnosis not present

## 2022-02-05 DIAGNOSIS — M8588 Other specified disorders of bone density and structure, other site: Secondary | ICD-10-CM | POA: Diagnosis not present

## 2022-02-05 DIAGNOSIS — M5136 Other intervertebral disc degeneration, lumbar region: Secondary | ICD-10-CM | POA: Diagnosis not present

## 2022-02-05 DIAGNOSIS — E039 Hypothyroidism, unspecified: Secondary | ICD-10-CM | POA: Diagnosis not present

## 2022-02-05 DIAGNOSIS — E782 Mixed hyperlipidemia: Secondary | ICD-10-CM | POA: Diagnosis not present

## 2022-02-05 DIAGNOSIS — G720 Drug-induced myopathy: Secondary | ICD-10-CM | POA: Diagnosis not present

## 2022-02-05 DIAGNOSIS — R69 Illness, unspecified: Secondary | ICD-10-CM | POA: Diagnosis not present

## 2022-02-05 DIAGNOSIS — R911 Solitary pulmonary nodule: Secondary | ICD-10-CM | POA: Diagnosis not present

## 2022-02-05 DIAGNOSIS — Z0000 Encounter for general adult medical examination without abnormal findings: Secondary | ICD-10-CM | POA: Diagnosis not present

## 2022-02-14 DIAGNOSIS — Z1231 Encounter for screening mammogram for malignant neoplasm of breast: Secondary | ICD-10-CM | POA: Diagnosis not present

## 2022-02-14 DIAGNOSIS — Z780 Asymptomatic menopausal state: Secondary | ICD-10-CM | POA: Diagnosis not present

## 2022-02-14 DIAGNOSIS — M8588 Other specified disorders of bone density and structure, other site: Secondary | ICD-10-CM | POA: Diagnosis not present

## 2022-02-14 DIAGNOSIS — M810 Age-related osteoporosis without current pathological fracture: Secondary | ICD-10-CM | POA: Diagnosis not present

## 2022-02-18 DIAGNOSIS — R928 Other abnormal and inconclusive findings on diagnostic imaging of breast: Secondary | ICD-10-CM | POA: Diagnosis not present

## 2022-02-18 DIAGNOSIS — N6489 Other specified disorders of breast: Secondary | ICD-10-CM | POA: Diagnosis not present

## 2022-02-18 DIAGNOSIS — R922 Inconclusive mammogram: Secondary | ICD-10-CM | POA: Diagnosis not present

## 2022-03-07 DIAGNOSIS — E039 Hypothyroidism, unspecified: Secondary | ICD-10-CM | POA: Diagnosis not present

## 2022-03-07 DIAGNOSIS — U071 COVID-19: Secondary | ICD-10-CM | POA: Diagnosis not present

## 2022-03-07 DIAGNOSIS — E782 Mixed hyperlipidemia: Secondary | ICD-10-CM | POA: Diagnosis not present

## 2022-04-14 DIAGNOSIS — D692 Other nonthrombocytopenic purpura: Secondary | ICD-10-CM | POA: Diagnosis not present

## 2022-04-14 DIAGNOSIS — L820 Inflamed seborrheic keratosis: Secondary | ICD-10-CM | POA: Diagnosis not present

## 2022-04-14 DIAGNOSIS — L570 Actinic keratosis: Secondary | ICD-10-CM | POA: Diagnosis not present

## 2022-04-14 DIAGNOSIS — L918 Other hypertrophic disorders of the skin: Secondary | ICD-10-CM | POA: Diagnosis not present

## 2022-04-14 DIAGNOSIS — L821 Other seborrheic keratosis: Secondary | ICD-10-CM | POA: Diagnosis not present

## 2022-04-14 DIAGNOSIS — Z85828 Personal history of other malignant neoplasm of skin: Secondary | ICD-10-CM | POA: Diagnosis not present

## 2022-04-23 DIAGNOSIS — K2289 Other specified disease of esophagus: Secondary | ICD-10-CM | POA: Diagnosis not present

## 2022-04-23 DIAGNOSIS — R911 Solitary pulmonary nodule: Secondary | ICD-10-CM | POA: Diagnosis not present

## 2022-04-23 DIAGNOSIS — R059 Cough, unspecified: Secondary | ICD-10-CM | POA: Diagnosis not present

## 2022-04-23 DIAGNOSIS — R062 Wheezing: Secondary | ICD-10-CM | POA: Diagnosis not present

## 2022-04-28 DIAGNOSIS — E785 Hyperlipidemia, unspecified: Secondary | ICD-10-CM | POA: Diagnosis not present

## 2022-04-28 DIAGNOSIS — K760 Fatty (change of) liver, not elsewhere classified: Secondary | ICD-10-CM | POA: Diagnosis not present

## 2022-04-29 ENCOUNTER — Ambulatory Visit (HOSPITAL_COMMUNITY)
Admission: RE | Admit: 2022-04-29 | Discharge: 2022-04-29 | Disposition: A | Discharge status: Home / Self Care | Payer: Medicare HMO | Source: Ambulatory Visit | Attending: Internal Medicine | Admitting: Internal Medicine

## 2022-04-29 DIAGNOSIS — R911 Solitary pulmonary nodule: Secondary | ICD-10-CM | POA: Diagnosis not present

## 2022-04-29 DIAGNOSIS — R918 Other nonspecific abnormal finding of lung field: Secondary | ICD-10-CM | POA: Diagnosis not present

## 2022-04-29 LAB — NMR, LIPOPROFILE
Cholesterol, Total: 210 mg/dL — ABNORMAL HIGH (ref 100–199)
HDL Particle Number: 31.3 umol/L (ref >=30.5)
HDL-C: 61 mg/dL (ref >39)
LDL Particle Number: 1233 nmol/L — ABNORMAL HIGH (ref <1000)
LDL Size: 21.2 nm (ref >20.5)
LDL-C (NIH Calc): 117 mg/dL — ABNORMAL HIGH (ref 0–99)
LP-IR Score: 31 (ref <=45)
Small LDL Particle Number: 224 nmol/L (ref <=527)
Triglycerides: 184 mg/dL — ABNORMAL HIGH (ref 0–149)

## 2022-04-29 LAB — HEPATIC FUNCTION PANEL
ALT: 18 IU/L (ref 0–32)
AST: 19 IU/L (ref 0–40)
Albumin: 4.1 g/dL (ref 3.9–4.9)
Alkaline Phosphatase: 78 IU/L (ref 44–121)
Bilirubin Total: 0.4 mg/dL (ref 0.0–1.2)
Bilirubin, Direct: 0.14 mg/dL (ref 0.00–0.40)
Total Protein: 6.1 g/dL (ref 6.0–8.5)

## 2022-04-29 LAB — LIPOPROTEIN A (LPA): Lipoprotein (a): 27.4 nmol/L (ref <75.0)

## 2022-05-06 ENCOUNTER — Encounter: Payer: Self-pay | Admitting: Internal Medicine

## 2022-05-06 ENCOUNTER — Ambulatory Visit: Payer: Medicare HMO | Attending: Internal Medicine | Admitting: Internal Medicine

## 2022-05-06 VITALS — BP 136/72 | HR 64 | Ht 66.0 in | Wt 208.0 lb

## 2022-05-06 DIAGNOSIS — R911 Solitary pulmonary nodule: Secondary | ICD-10-CM

## 2022-05-06 DIAGNOSIS — M791 Myalgia, unspecified site: Secondary | ICD-10-CM | POA: Diagnosis not present

## 2022-05-06 DIAGNOSIS — K760 Fatty (change of) liver, not elsewhere classified: Secondary | ICD-10-CM | POA: Diagnosis not present

## 2022-05-06 DIAGNOSIS — E785 Hyperlipidemia, unspecified: Secondary | ICD-10-CM | POA: Diagnosis not present

## 2022-05-06 DIAGNOSIS — T466X5D Adverse effect of antihyperlipidemic and antiarteriosclerotic drugs, subsequent encounter: Secondary | ICD-10-CM

## 2022-05-06 NOTE — Progress Notes (Signed)
LIPID CLINIC CONSULT NOTE  Chief Complaint:  Follow-up dyslipidemia  Primary Care Physician: Irven Coe, MD  Primary Cardiologist:  None  HPI:  Heather Allison is a 72 y.o. female who is being seen today for the evaluation of dyslipidemia at the request of Maurice Small, MD. this is a pleasant 72 year old female who with a history of dyslipidemia.  Unfortunately she has been statin intolerant.  She is tried 3 different statins in the past all of which caused significant side effects.  This included simvastatin, atorvastatin and rosuvastatin.  She also had tried gemfibrozil which caused her heart to race.  More recently she had repeat lipids which showed total cholesterol of 220, HDL 44, triglycerides 283 and LDL 127.  She has no known coronary disease but does have some aortic atherosclerosis.  There is a family history of heart disease in her father who had an MI in his 65s and bypass surgery afterwards and died in his 48s.  She reports fairly low-fat diet and gets intermittent exercise, typically when she goes to the beach she walks regularly but not when she is at home.  09/11/2021  Ms. Deats returns today for follow-up.  Unfortunately she was not able to tolerate Livalo.  At this point I would consider her statin intolerant.  Her last lipids recently showed total cholesterol 215, triglycerides 190, HDL 47 and LDL 134.  She does have some aortic atherosclerosis but no known cardiovascular disease.  I do think we need to conceptualize this better to determine her targets of therapy.  01/02/2022  Ms. Crafts returns today for follow-up.  She underwent coronary calcium scoring which showed an elevated calcium score of 36.5, 62nd percentile for age and sex matched controls.  This was predominantly in the LAD.  There were however some extracardiac findings including a suspicious 7 mm left solid pulmonary nodule within the upper lobe.  A repeat CT scan was recommended to rule out  possible malignancy.  We have already ordered this to be repeated in 6 months.  Additionally there was distal esophageal thickening concerning for possible esophagitis or infection.  I would advise GI evaluation of this.  Aortic atherosclerosis was noted.  05/06/2022  Mrs. Pals is seen today in follow-up.  She said she has been fairly ill thus far during the year.  She had COVID in Christmas and has had issues with allergies and upper respiratory symptoms.  She recently was on steroids and had some tachycardia associated with that.  She had a follow-up CT scan which shows stable pulmonary nodules however some new basilar nodules were noted that could not be compared because the field-of-view was limited on the previous CT scan.  A conservative recommendation was to repeat that scan in 1 year.  She does have a remote smoking history.  She had repeat lipids which showed a normal LP(a) 27.4 nmol/L.  Her cholesterol has improved somewhat however not at target with particle #[id number], LDL 117, HDL 61 and triglycerides 184.  Liver enzymes were normal.  She is on Zetia monotherapy.  She could not tolerate statins.  PMHx:  Past Medical History:  Diagnosis Date   Cataract    Diverticulitis 09/2018   Hypertensive retinopathy    Hypothyroidism    PONV (postoperative nausea and vomiting)    Varicose veins    Wears glasses     Past Surgical History:  Procedure Laterality Date   ABDOMINAL HYSTERECTOMY     APPENDECTOMY  23009   lap appendix-rt  adrenal mass   BACK SURGERY  01/2018   HERNIATED DISK   CHOLECYSTECTOMY  1997   COLONOSCOPY     FOOT SURGERY  2013   right-plantar    KNEE ARTHROSCOPY WITH MEDIAL MENISECTOMY Left 08/25/2013   Procedure: KNEE ARTHROSCOPY WITH DEBRIDEMENT/SHAVING (CHONDROPLASTY),WITH MENISCECTOMY MEDIAL;  Surgeon: Loreta Ave, MD;  Location: [facility];  Service: Orthopedics;  Laterality: Left;   ovaries removed  2009   rt overian cyst-lt ovary out   RECTAL  SURGERY     torn tissue   ROTATOR CUFF REPAIR      FAMHx:  No family history on file.  SOCHx:   reports that she quit smoking about 14 years ago. Her smoking use included cigarettes. She has never used smokeless tobacco. She reports that she does not drink alcohol and does not use drugs.  ALLERGIES:  Allergies  Allergen Reactions   Doxycycline Nausea And Vomiting   Hydrocodone Nausea And Vomiting   Latex    Morphine And Related    Codeine Nausea And Vomiting   Erythromycin Nausea And Vomiting    And rash    ROS: Pertinent items noted in HPI and remainder of comprehensive ROS otherwise negative.  HOME MEDS: Current Outpatient Medications on File Prior to Visit  Medication Sig Dispense Refill   acetaminophen (TYLENOL) 325 MG tablet Take 2 tablets (650 mg total) by mouth every 6 (six) hours as needed for mild pain (or temp > 100).     ezetimibe (ZETIA) 10 MG tablet Take 1 tablet (10 mg total) by mouth daily. 90 tablet 3   fluticasone (FLONASE) 50 MCG/ACT nasal spray as needed.     levothyroxine (SYNTHROID) 112 MCG tablet Take 112 mcg by mouth every morning.     Misc Natural Products (ELDERBERRY/VITAMIN C/ZINC) CHEW Chew by mouth.     PROBIOTIC PRODUCT PO Take by mouth.     No current facility-administered medications on file prior to visit.    LABS/IMAGING: No results found for this or any previous visit (from the past 48 hour(s)). No results found.  LIPID PANEL: No results found for: "CHOL", "TRIG", "HDL", "CHOLHDL", "VLDL", "LDLCALC", "LDLDIRECT"  WEIGHTS: Wt Readings from Last 3 Encounters:  05/06/22 208 lb (94.3 kg)  01/02/22 207 lb 9.6 oz (94.2 kg)  09/11/21 210 lb 6.4 oz (95.4 kg)    VITALS: BP 136/72   Pulse 64   Ht 5\' 6"  (1.676 m)   Wt 208 lb (94.3 kg)   SpO2 99%   BMI 33.57 kg/m   EXAM: Normal sinus rhythm with sinus arrhythmia at 64, incomplete right bundle branch block-personally reviewed  EKG: N/A  ASSESSMENT: Mixed dyslipidemia goal LDL  less than 70 Elevated CAC score of 36.5, 62nd percentile (in the LAD)-2023 Family history of premature onset coronary disease in her father Statin intolerance-myalgias History of fatty liver with elevated liver enzymes Pulmonary nodules  PLAN: 1.   Ms. Garguilo continues to have dyslipidemia above a target LDL less than 70.  She was found to have some coronary artery calcification.  I would like to get her LDL to less than 70.  She will likely need additional therapy but feels that she could get further improvement with diet and exercise.  Will plan another repeat lipid in 6 months.  Fortunately her LP(a) was negative.  Liver enzymes are normal on Zetia.  EKG shows an incomplete right bundle branch block which is slightly changed compared to prior EKG in 2023.  She has had  no chest pain symptoms.  She did have some palpitations on prednisone which have improved.  I will also order a follow-up chest CT in 1 year for the newly recognized nodules that were not seen on the initial chest CT.  Follow-up in 6 months.  Chrystie Nose, MD, [facility], FACP  [place]  [facility]  Medical Director of the Advanced Lipid Disorders &  Cardiovascular Risk Reduction Clinic Diplomate of the American Board of Clinical Lipidology Attending Cardiologist  Direct Dial: [phone]  Fax: [phone]  Website:  [email or web address] 05/06/2022, 9:29 AM

## 2022-05-06 NOTE — Patient Instructions (Signed)
Medication Instructions:  Your physician recommends that you continue on your current medications as directed. Please refer to the Current Medication list given to you today.  *If you need a refill on your cardiac medications before your next appointment, please call your pharmacy*  Lab Work: Please return for FASTING labs in 6 months (NMR lipoprofile)  Our in office lab hours are Monday-Friday 8:00-4:00, closed for lunch 1-2 pm.  No appointment needed.  [facility] locations:   [facility] - [address]  - [address] ([facility]) - [address] [phone] [address]   [place] - [address]    [place]  - [address]    [place] - [address] - [address] [employer]  - [address] [place] - [address] Chief Technology Officer)  Testing/Procedures: CT chest in 12 months  Follow-Up: At [facility], you and your health needs are our priority.  As part of our continuing mission to provide you with exceptional heart care, we have created designated Provider Care Teams.  These Care Teams include your primary Cardiologist (physician) and Advanced Practice Providers (APPs -  Physician Assistants and Nurse Practitioners) who all work together to provide you with the care you need, when you need it.  We recommend signing up for the patient portal called "MyChart".  Sign up information is provided on this After Visit Summary.  MyChart is used to connect with patients for Virtual Visits (Telemedicine).  Patients are able to view lab/test results, encounter notes, upcoming appointments, etc.  Non-urgent messages can be sent to your provider as well.   To learn more about what you can do with MyChart, go to [email or web address].    Your next appointment:   6 month(s)  Provider:   Dr. Rennis Golden

## 2022-05-26 DIAGNOSIS — R933 Abnormal findings on diagnostic imaging of other parts of digestive tract: Secondary | ICD-10-CM | POA: Diagnosis not present

## 2022-05-26 DIAGNOSIS — R140 Abdominal distension (gaseous): Secondary | ICD-10-CM | POA: Diagnosis not present

## 2022-05-26 DIAGNOSIS — E6609 Other obesity due to excess calories: Secondary | ICD-10-CM | POA: Diagnosis not present

## 2022-07-30 DIAGNOSIS — G720 Drug-induced myopathy: Secondary | ICD-10-CM | POA: Diagnosis not present

## 2022-07-30 DIAGNOSIS — K589 Irritable bowel syndrome without diarrhea: Secondary | ICD-10-CM | POA: Diagnosis not present

## 2022-07-30 DIAGNOSIS — R21 Rash and other nonspecific skin eruption: Secondary | ICD-10-CM | POA: Diagnosis not present

## 2022-07-30 DIAGNOSIS — I700 Atherosclerosis of aorta: Secondary | ICD-10-CM | POA: Diagnosis not present

## 2022-07-30 DIAGNOSIS — E782 Mixed hyperlipidemia: Secondary | ICD-10-CM | POA: Diagnosis not present

## 2022-07-30 DIAGNOSIS — R911 Solitary pulmonary nodule: Secondary | ICD-10-CM | POA: Diagnosis not present

## 2022-07-30 DIAGNOSIS — E039 Hypothyroidism, unspecified: Secondary | ICD-10-CM | POA: Diagnosis not present

## 2022-07-30 DIAGNOSIS — K2289 Other specified disease of esophagus: Secondary | ICD-10-CM | POA: Diagnosis not present

## 2022-09-05 ENCOUNTER — Telehealth: Payer: Self-pay | Admitting: Gastroenterology

## 2022-09-05 NOTE — Telephone Encounter (Signed)
Good morning Dr Myrtie Neither   Patient has been referred to you to be seen for Bloating.   Patient  has previous history with Dr Kinnie Scales over the years and was recently seen wit Dr Elnoria Howard in may of this year .   Patient requesting transfer due to not being satisfied with most recent provider she seen. As well as her previous dr over the years retiring.   Records are available for review in epic. Please review and advise on scheduling.   Thank you

## 2022-09-05 NOTE — Telephone Encounter (Signed)
Request received to transfer GI care from outside practice to  GI.  We appreciate the interest in our practice, however due to high demand from patients without established GI providers, we cannot accommodate this transfer.    - H. Myrtie Neither, MD

## 2022-09-10 NOTE — Telephone Encounter (Signed)
Left detailed message with recommendations.

## 2022-09-30 ENCOUNTER — Encounter (INDEPENDENT_AMBULATORY_CARE_PROVIDER_SITE_OTHER): Payer: Medicare HMO | Admitting: Ophthalmology

## 2022-10-14 NOTE — Progress Notes (Signed)
[facility] - Clinic Note  10/28/2022    CHIEF COMPLAINT Patient presents for Retina Follow Up   HISTORY OF PRESENT ILLNESS: Heather Allison is a 72 y.o. female who presents to the clinic today for:   HPI     Retina Follow Up   Patient presents with  Other.  In both eyes.  This started 10 months ago.  I, the attending physician,  performed the HPI with the patient and updated documentation appropriately.        Comments   Patient here for 10 months retina follow up for bilateral retinoschisi. Patient states vision doing good. No eye pain.       Last edited by Rennis Chris, MD on 10/29/2022 12:09 AM.     Pt states vision is stable, she is not seeing any fol or floaters, she has an appt with Dr. Charise Killian in December, she uses [facility] B[facility] OU  Referring physician: Daisy Lazar, DO [address] [place],  [place] [address]  HISTORICAL INFORMATION:   Selected notes from the MEDICAL RECORD NUMBER Referred by Dr. Alberteen Spindle ([place], [facility]) for eval of retinoschisis OU   CURRENT MEDICATIONS: No current outpatient medications on file. (Ophthalmic Drugs)   No current facility-administered medications for this visit. (Ophthalmic Drugs)   Current Outpatient Medications (Other)  Medication Sig   acetaminophen (TYLENOL) 325 MG tablet Take 2 tablets (650 mg total) by mouth every 6 (six) hours as needed for mild pain (or temp > 100).   ezetimibe (ZETIA) 10 MG tablet Take 1 tablet (10 mg total) by mouth daily.   fluticasone (FLONASE) 50 MCG/ACT nasal spray as needed.   levothyroxine (SYNTHROID) 112 MCG tablet Take 112 mcg by mouth every morning.   Misc Natural Products (ELDERBERRY/VITAMIN C/ZINC) CHEW Chew by mouth.   PROBIOTIC PRODUCT PO Take by mouth.   No current facility-administered medications for this visit. (Other)   REVIEW OF SYSTEMS: ROS   Positive for: Eyes Negative for: Constitutional, Gastrointestinal, Neurological, Skin, Genitourinary,  Musculoskeletal, HENT, Endocrine, Cardiovascular, Respiratory, Psychiatric, Allergic/Imm, Heme/Lymph Last edited by Laddie Aquas, COA on 10/28/2022  9:16 AM.      ALLERGIES Allergies  Allergen Reactions   Statins Other (See Comments)   Doxycycline Nausea And Vomiting   Hydrocodone Nausea And Vomiting   Latex    Morphine And Codeine    Codeine Nausea And Vomiting   Erythromycin Nausea And Vomiting    And rash   PAST MEDICAL HISTORY Past Medical History:  Diagnosis Date   Cataract    Diverticulitis 09/2018   Hypertensive retinopathy    Hypothyroidism    PONV (postoperative nausea and vomiting)    Varicose veins    Wears glasses    Past Surgical History:  Procedure Laterality Date   ABDOMINAL HYSTERECTOMY     APPENDECTOMY  23009   lap appendix-rt adrenal mass   BACK SURGERY  01/2018   HERNIATED DISK   CHOLECYSTECTOMY  1997   COLONOSCOPY     FOOT SURGERY  2013   right-plantar    KNEE ARTHROSCOPY WITH MEDIAL MENISECTOMY Left 08/25/2013   Procedure: KNEE ARTHROSCOPY WITH DEBRIDEMENT/SHAVING (CHONDROPLASTY),WITH MENISCECTOMY MEDIAL;  Surgeon: Loreta Ave, MD;  Location: [facility];  Service: Orthopedics;  Laterality: Left;   ovaries removed  2009   rt overian cyst-lt ovary out   RECTAL SURGERY     torn tissue   ROTATOR CUFF REPAIR     FAMILY HISTORY History reviewed. No pertinent family  history.  SOCIAL HISTORY Social History   Tobacco Use   Smoking status: Former    Current packs/day: 0.00    Types: Cigarettes    Quit date: 01/14/2008    Years since quitting: 14.8   Smokeless tobacco: Never  Vaping Use   Vaping status: Never Used  Substance Use Topics   Alcohol use: No   Drug use: No       OPHTHALMIC EXAM: Base Eye Exam     Visual Acuity (Snellen - Linear)       Right Left   Dist cc 20/20 20/20 -1    Correction: Glasses         Tonometry (Tonopen, 9:13 AM)       Right Left   Pressure 13 15         Pupils        Dark Light Shape React APD   Right 3 2 Round Brisk None   Left 3 2 Round Brisk None         Visual Fields (Counting fingers)       Left Right    Full Full         Extraocular Movement       Right Left    Full, Ortho Full, Ortho         Neuro/Psych     Oriented x3: Yes   Mood/Affect: Normal         Dilation     Both eyes: 1.0% Mydriacyl, 2.5% Phenylephrine @ 9:13 AM           Slit Lamp and Fundus Exam     Slit Lamp Exam       Right Left   Lids/Lashes Dermato, mild MGD Dermato, mild MGD   Conjunctiva/Sclera White and quiet White and quiet   Cornea Trace PEE, mild tear film debris 1+ PEE, tear film debris   Anterior Chamber deep, clear, narrow temporal angle deep, clear, narrow temporal angle   Iris Round and dilated Round and dilated   Lens 2+ NS, 2-3+ cortical 2+ NS, 2-3+ cortical   Anterior Vitreous Syneresis Syneresis         Fundus Exam       Right Left   Disc Pink, sharp, mild PPA Pink. sharp, mild PPA   C/D Ratio 0.3 0.5   Macula Flat, good foveal reflex, mild RPE mottling, no heme or edema Flat, good foveal reflex, fine drusen, mild RPE mottling, no heme or edema   Vessels attenuated, mild tortuosity attenuated, mild tortuosity   Periphery Attached, mild pavingstone inferiorly, scattered reticular degeneration, mild peripheral drusen, shallow schisis and +SRF  IT (0700-0900) and bullous schisis ST periphery (1000-1130) -- good laser surrounding all lesions from 0700 to 1130 -- good fill in changes at 0800-0900; no new RT/RD or schisis Attached, temporal retinoschisis w/ret hole and focal SRF at 0300 -- schisis detachment -- good laser changes surrounding from 0100-0430, no new RT/RD or schisis, mild peripheral drusen, no heme           Refraction     Wearing Rx       Sphere Cylinder Axis Add   Right +2.50 +1.50 174 +2.25   Left +2.00 +1.00 177 +2.25           IMAGING AND PROCEDURES  Imaging and Procedures for 10/28/2022  OCT,  Retina - OU - Both Eyes       Right Eye Quality was good. Central Foveal Thickness: 303. Progression has been stable.  Findings include normal foveal contour, no IRF, subretinal fluid, vitreomacular adhesion (Shallow SRF IT periphery caught on widefield; Bullous schisis ST periphery -- not imaged today).   Left Eye Quality was good. Central Foveal Thickness: 299. Progression has been stable. Findings include normal foveal contour, no IRF, subretinal fluid, vitreomacular adhesion (+Shallow SRF and schisis ST and IT periphery caught on widefield).   Notes *Images captured and stored on drive  Diagnosis / Impression:  NFP; no IRF/SRF centrally OU OD: Shallow SRF IT periphery caught on widefield; Bullous schisis ST periphery -- not imaged today OS: Shallow SRF within schisis cavity -- ST and IT periphery -- stable  Clinical management:  See below  Abbreviations: NFP - Normal foveal profile. CME - cystoid macular edema. PED - pigment epithelial detachment. IRF - intraretinal fluid. SRF - subretinal fluid. EZ - ellipsoid zone. ERM - epiretinal membrane. ORA - outer retinal atrophy. ORT - outer retinal tubulation. SRHM - subretinal hyper-reflective material. 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 Bilateral retinal detachment  H33.23     3. Essential hypertension  I10     4. Hypertensive retinopathy of both eyes  H35.033     5. Combined forms of age-related cataract of both eyes  H25.813     6. Retinal hole of left eye  H33.322      1,2. 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          - OD: Shallow schisis and SRF IT (6578-4696) and bullous schisis ST periphery (1000-1130)  - OS: Schisis detachment -- temporal schisis w/ ret hole and focal SRF @ 0300 -- good laser changes surrounding from (0100-0430) - s/p laser retinopexy OS (10.04.22) --  good laser changes in place - s/p laser retinopexy OD (10.25.22), touch-up (02.15.23) for schisis RD -- good laser changes in place from 0700-1130 -- good fill in laser from 0800-0900          - VA 20/20 OU, pt remains asymptomatic  - no new RT/RD or lattice  - Reviewed s/s of RT/RD  - Strict return precautions for any such RT/RD signs/symptoms          - f/u 9 months, sooner prn, DFE, OCT  3,4. Hypertensive retinopathy OU - discussed importance of tight BP control - monitor  5. Mixed Cataract OU - The symptoms of cataract, surgical options, and treatments and risks were discussed with patient. - discussed diagnosis and progression  - mild progression from prior  - monitor  Ophthalmic Meds Ordered this visit:  No orders of the defined types were placed in this encounter.    Return in about 9 months (around 07/28/2023) for f/u  retinoschisis OU, DFE, OCT.  There are no Patient Instructions on file for this visit.  Explained the diagnoses, plan, and follow up with the patient and they expressed understanding.  Patient expressed understanding of the importance of proper follow up care.   This document serves as a record of services personally performed by Karie Chimera, MD, PhD. It was created on their behalf by Charlette Caffey, COT an ophthalmic technician. The creation of this record is the provider's dictation and/or activities during the visit.    Electronically signed by:  Charlette Caffey, COT  10/29/22 12:09 AM  This document serves as a record  of services personally performed by Karie Chimera, MD, PhD. It was created on their behalf by Glee Arvin. Manson Passey, OA an ophthalmic technician. The creation of this record is the provider's dictation and/or activities during the visit.    Electronically signed by: Glee Arvin. Manson Passey, OA 10/29/22 12:09 AM  Karie Chimera, M.D., Ph.D. Diseases & Surgery of the Retina and Vitreous [facility]  I have reviewed  the above documentation for accuracy and completeness, and I agree with the above. Karie Chimera, M.D., Ph.D. 10/29/22 12:10 AM   Abbreviations: M myopia (nearsighted); A astigmatism; H hyperopia (farsighted); P presbyopia; Mrx spectacle prescription;  CTL contact lenses; OD right eye; OS left eye; OU both eyes  XT exotropia; ET esotropia; PEK punctate epithelial keratitis; PEE punctate epithelial erosions; DES dry eye syndrome; MGD meibomian gland dysfunction; ATs artificial tears; PFAT's preservative free artificial tears; NSC nuclear sclerotic cataract; PSC posterior subcapsular cataract; ERM epi-retinal membrane; PVD posterior vitreous detachment; RD retinal detachment; DM diabetes mellitus; DR diabetic retinopathy; NPDR non-proliferative diabetic retinopathy; PDR proliferative diabetic retinopathy; CSME clinically significant macular edema; DME diabetic macular edema; dbh dot blot hemorrhages; CWS cotton wool spot; POAG primary open angle glaucoma; C/D cup-to-disc ratio; HVF humphrey visual field; GVF goldmann visual field; OCT optical coherence tomography; IOP intraocular pressure; BRVO Branch retinal vein occlusion; CRVO central retinal vein occlusion; CRAO central retinal artery occlusion; BRAO branch retinal artery occlusion; RT retinal tear; SB scleral buckle; PPV pars plana vitrectomy; VH Vitreous hemorrhage; PRP panretinal laser photocoagulation; IVK intravitreal kenalog; VMT vitreomacular traction; MH Macular hole;  NVD neovascularization of the disc; NVE neovascularization elsewhere; AREDS age related eye disease study; ARMD age related macular degeneration; POAG primary open angle glaucoma; EBMD epithelial/anterior basement membrane dystrophy; ACIOL anterior chamber intraocular lens; IOL intraocular lens; PCIOL posterior chamber intraocular lens; Phaco/IOL phacoemulsification with intraocular lens placement; PRK photorefractive keratectomy; LASIK laser assisted in situ keratomileusis; HTN  hypertension; DM diabetes mellitus; COPD chronic obstructive pulmonary disease

## 2022-10-28 ENCOUNTER — Encounter (INDEPENDENT_AMBULATORY_CARE_PROVIDER_SITE_OTHER): Payer: Self-pay | Admitting: Ophthalmology

## 2022-10-28 ENCOUNTER — Ambulatory Visit (INDEPENDENT_AMBULATORY_CARE_PROVIDER_SITE_OTHER): Payer: Medicare HMO | Admitting: Ophthalmology

## 2022-10-28 DIAGNOSIS — H33103 Unspecified retinoschisis, bilateral: Secondary | ICD-10-CM | POA: Diagnosis not present

## 2022-10-28 DIAGNOSIS — H3323 Serous retinal detachment, bilateral: Secondary | ICD-10-CM | POA: Diagnosis not present

## 2022-10-28 DIAGNOSIS — H33322 Round hole, left eye: Secondary | ICD-10-CM

## 2022-10-28 DIAGNOSIS — H25813 Combined forms of age-related cataract, bilateral: Secondary | ICD-10-CM | POA: Diagnosis not present

## 2022-10-28 DIAGNOSIS — H35033 Hypertensive retinopathy, bilateral: Secondary | ICD-10-CM

## 2022-10-28 DIAGNOSIS — I10 Essential (primary) hypertension: Secondary | ICD-10-CM | POA: Diagnosis not present

## 2022-10-29 ENCOUNTER — Encounter (INDEPENDENT_AMBULATORY_CARE_PROVIDER_SITE_OTHER): Payer: Self-pay | Admitting: Ophthalmology

## 2022-12-03 ENCOUNTER — Other Ambulatory Visit: Payer: Self-pay | Admitting: Internal Medicine

## 2022-12-23 DIAGNOSIS — J329 Chronic sinusitis, unspecified: Secondary | ICD-10-CM | POA: Diagnosis not present

## 2022-12-30 DIAGNOSIS — H5203 Hypermetropia, bilateral: Secondary | ICD-10-CM | POA: Diagnosis not present

## 2023-02-16 DIAGNOSIS — Z1231 Encounter for screening mammogram for malignant neoplasm of breast: Secondary | ICD-10-CM | POA: Diagnosis not present

## 2023-02-27 DIAGNOSIS — K2289 Other specified disease of esophagus: Secondary | ICD-10-CM | POA: Diagnosis not present

## 2023-02-27 DIAGNOSIS — E782 Mixed hyperlipidemia: Secondary | ICD-10-CM | POA: Diagnosis not present

## 2023-02-27 DIAGNOSIS — E039 Hypothyroidism, unspecified: Secondary | ICD-10-CM | POA: Diagnosis not present

## 2023-03-04 DIAGNOSIS — K589 Irritable bowel syndrome without diarrhea: Secondary | ICD-10-CM | POA: Diagnosis not present

## 2023-03-04 DIAGNOSIS — R609 Edema, unspecified: Secondary | ICD-10-CM | POA: Diagnosis not present

## 2023-03-04 DIAGNOSIS — G720 Drug-induced myopathy: Secondary | ICD-10-CM | POA: Diagnosis not present

## 2023-03-04 DIAGNOSIS — R911 Solitary pulmonary nodule: Secondary | ICD-10-CM | POA: Diagnosis not present

## 2023-03-04 DIAGNOSIS — E039 Hypothyroidism, unspecified: Secondary | ICD-10-CM | POA: Diagnosis not present

## 2023-03-04 DIAGNOSIS — K2289 Other specified disease of esophagus: Secondary | ICD-10-CM | POA: Diagnosis not present

## 2023-03-04 DIAGNOSIS — Z0000 Encounter for general adult medical examination without abnormal findings: Secondary | ICD-10-CM | POA: Diagnosis not present

## 2023-03-04 DIAGNOSIS — D649 Anemia, unspecified: Secondary | ICD-10-CM | POA: Diagnosis not present

## 2023-03-04 DIAGNOSIS — F411 Generalized anxiety disorder: Secondary | ICD-10-CM | POA: Diagnosis not present

## 2023-03-04 DIAGNOSIS — H35033 Hypertensive retinopathy, bilateral: Secondary | ICD-10-CM | POA: Diagnosis not present

## 2023-03-04 DIAGNOSIS — E782 Mixed hyperlipidemia: Secondary | ICD-10-CM | POA: Diagnosis not present

## 2023-04-07 DIAGNOSIS — D649 Anemia, unspecified: Secondary | ICD-10-CM | POA: Diagnosis not present

## 2023-04-29 ENCOUNTER — Ambulatory Visit (HOSPITAL_COMMUNITY)
Admission: RE | Admit: 2023-04-29 | Discharge: 2023-04-29 | Disposition: A | Discharge status: Home / Self Care | Payer: Medicare HMO | Source: Ambulatory Visit | Attending: Internal Medicine | Admitting: Internal Medicine

## 2023-04-29 DIAGNOSIS — R918 Other nonspecific abnormal finding of lung field: Secondary | ICD-10-CM | POA: Diagnosis not present

## 2023-04-29 DIAGNOSIS — R911 Solitary pulmonary nodule: Secondary | ICD-10-CM | POA: Diagnosis not present

## 2023-04-29 DIAGNOSIS — I700 Atherosclerosis of aorta: Secondary | ICD-10-CM | POA: Diagnosis not present

## 2023-05-08 ENCOUNTER — Encounter: Payer: Self-pay | Admitting: Internal Medicine

## 2023-05-25 DIAGNOSIS — Z8719 Personal history of other diseases of the digestive system: Secondary | ICD-10-CM | POA: Diagnosis not present

## 2023-05-25 DIAGNOSIS — R194 Change in bowel habit: Secondary | ICD-10-CM | POA: Diagnosis not present

## 2023-05-25 DIAGNOSIS — R140 Abdominal distension (gaseous): Secondary | ICD-10-CM | POA: Diagnosis not present

## 2023-05-26 ENCOUNTER — Ambulatory Visit (HOSPITAL_BASED_OUTPATIENT_CLINIC_OR_DEPARTMENT_OTHER): Payer: Self-pay | Admitting: Internal Medicine

## 2023-05-28 ENCOUNTER — Other Ambulatory Visit: Payer: Self-pay | Admitting: Internal Medicine

## 2023-06-23 DIAGNOSIS — D122 Benign neoplasm of ascending colon: Secondary | ICD-10-CM | POA: Diagnosis not present

## 2023-06-23 DIAGNOSIS — R194 Change in bowel habit: Secondary | ICD-10-CM | POA: Diagnosis not present

## 2023-06-24 DIAGNOSIS — D692 Other nonthrombocytopenic purpura: Secondary | ICD-10-CM | POA: Diagnosis not present

## 2023-06-24 DIAGNOSIS — D2239 Melanocytic nevi of other parts of face: Secondary | ICD-10-CM | POA: Diagnosis not present

## 2023-06-24 DIAGNOSIS — L282 Other prurigo: Secondary | ICD-10-CM | POA: Diagnosis not present

## 2023-06-24 DIAGNOSIS — L821 Other seborrheic keratosis: Secondary | ICD-10-CM | POA: Diagnosis not present

## 2023-06-24 DIAGNOSIS — L820 Inflamed seborrheic keratosis: Secondary | ICD-10-CM | POA: Diagnosis not present

## 2023-06-24 DIAGNOSIS — L570 Actinic keratosis: Secondary | ICD-10-CM | POA: Diagnosis not present

## 2023-06-24 DIAGNOSIS — L814 Other melanin hyperpigmentation: Secondary | ICD-10-CM | POA: Diagnosis not present

## 2023-06-24 DIAGNOSIS — Z85828 Personal history of other malignant neoplasm of skin: Secondary | ICD-10-CM | POA: Diagnosis not present

## 2023-06-26 DIAGNOSIS — D122 Benign neoplasm of ascending colon: Secondary | ICD-10-CM | POA: Diagnosis not present

## 2023-07-28 ENCOUNTER — Encounter (INDEPENDENT_AMBULATORY_CARE_PROVIDER_SITE_OTHER): Payer: Medicare HMO | Admitting: Ophthalmology

## 2023-07-28 NOTE — Progress Notes (Signed)
 [facility] - Clinic Note  07/31/2023    CHIEF COMPLAINT Patient presents for Retina Follow Up   HISTORY OF PRESENT ILLNESS: Heather Allison is a 73 y.o. female who presents to the clinic today for:   HPI     Retina Follow Up   In both eyes.  This started 9 months ago.  Duration of 9 months.  Since onset it is stable.  I, the attending physician,  performed the HPI with the patient and updated documentation appropriately.        Comments   9 month retina follow up retinoschisis ou pt is reporting no vision changes noticed she denies any flashes or floaters       Last edited by Valdemar Rogue, MD on 08/02/2023  3:21 PM.    Pt states no VA changes, no floaters or FOL reported.   Referring physician: Darroll Anes, DO [address] [place],  [place] [address]  HISTORICAL INFORMATION:   Selected notes from the MEDICAL RECORD NUMBER Referred by Dr. Neill ([place], [facility]) for eval of retinoschisis OU   CURRENT MEDICATIONS: No current outpatient medications on file. (Ophthalmic Drugs)   No current facility-administered medications for this visit. (Ophthalmic Drugs)   Current Outpatient Medications (Other)  Medication Sig   acetaminophen  (TYLENOL ) 325 MG tablet Take 2 tablets (650 mg total) by mouth every 6 (six) hours as needed for mild pain (or temp > 100).   ezetimibe  (ZETIA ) 10 MG tablet TAKE 1 TABLET BY MOUTH EVERY DAY   fluticasone (FLONASE) 50 MCG/ACT nasal spray as needed.   levothyroxine  (SYNTHROID ) 112 MCG tablet Take 112 mcg by mouth every morning.   Misc Natural Products (ELDERBERRY/VITAMIN C/ZINC) CHEW Chew by mouth.   PROBIOTIC PRODUCT PO Take by mouth.   No current facility-administered medications for this visit. (Other)   REVIEW OF SYSTEMS: ROS   Positive for: Eyes Negative for: Constitutional, Gastrointestinal, Neurological, Skin, Genitourinary, Musculoskeletal, HENT, Endocrine, Cardiovascular, Respiratory, Psychiatric,  Allergic/Imm, Heme/Lymph Last edited by Resa Delon ORN, COT on 07/31/2023  9:17 AM.     ALLERGIES Allergies  Allergen Reactions   Statins Other (See Comments)   Doxycycline Nausea And Vomiting   Hydrocodone Nausea And Vomiting   Latex    Morphine And Codeine    Codeine Nausea And Vomiting   Erythromycin Nausea And Vomiting    And rash   PAST MEDICAL HISTORY Past Medical History:  Diagnosis Date   Cataract    Diverticulitis 09/2018   Hypertensive retinopathy    Hypothyroidism    PONV (postoperative nausea and vomiting)    Varicose veins    Wears glasses    Past Surgical History:  Procedure Laterality Date   ABDOMINAL HYSTERECTOMY     APPENDECTOMY  23009   lap appendix-rt adrenal mass   BACK SURGERY  01/2018   HERNIATED DISK   CHOLECYSTECTOMY  1997   COLONOSCOPY     FOOT SURGERY  2013   right-plantar    KNEE ARTHROSCOPY WITH MEDIAL MENISECTOMY Left 08/25/2013   Procedure: KNEE ARTHROSCOPY WITH DEBRIDEMENT/SHAVING (CHONDROPLASTY),WITH MENISCECTOMY MEDIAL;  Surgeon: Toribio JULIANNA Chancy, MD;  Location: [facility];  Service: Orthopedics;  Laterality: Left;   ovaries removed  2009   rt overian cyst-lt ovary out   RECTAL SURGERY     torn tissue   ROTATOR CUFF REPAIR     FAMILY HISTORY History reviewed. No pertinent family history.  SOCIAL HISTORY Social History   Tobacco Use   Smoking  status: Former    Current packs/day: 0.00    Types: Cigarettes    Quit date: 01/14/2008    Years since quitting: 15.5   Smokeless tobacco: Never  Vaping Use   Vaping status: Never Used  Substance Use Topics   Alcohol use: No   Drug use: No       OPHTHALMIC EXAM: Base Eye Exam     Visual Acuity (Snellen - Linear)       Right Left   Dist cc 20/20 -1 20/20 -1         Tonometry (Tonopen, 9:23 AM)       Right Left   Pressure 13 13         Pupils       Pupils Dark Light Shape React APD   Right PERRL 3 2 Round Brisk None   Left PERRL 3 2 Round  Brisk None         Visual Fields       Left Right    Full Full         Extraocular Movement       Right Left    Full, Ortho Full, Ortho         Neuro/Psych     Oriented x3: Yes   Mood/Affect: Normal         Dilation     Both eyes: 2.5% Phenylephrine @ 9:23 AM           Slit Lamp and Fundus Exam     Slit Lamp Exam       Right Left   Lids/Lashes Dermato, mild MGD Dermato, mild MGD   Conjunctiva/Sclera White and quiet White and quiet   Cornea Trace PEE, mild tear film debris 1+ PEE, tear film debris   Anterior Chamber deep, clear, narrow temporal angle deep, clear, narrow temporal angle   Iris Round and dilated Round and dilated   Lens 2+ NS, 2-3+ cortical 2+ NS, 2-3+ cortical   Anterior Vitreous Syneresis Syneresis         Fundus Exam       Right Left   Disc Pink, sharp, mild PPA Pink. sharp, mild PPA   C/D Ratio 0.3 0.5   Macula Flat, good foveal reflex, mild RPE mottling, no heme or edema Flat, good foveal reflex, fine drusen, mild RPE mottling, no heme or edema   Vessels attenuated, mild tortuosity attenuated, mild tortuosity   Periphery Attached, mild pavingstone inferiorly, scattered reticular degeneration, mild peripheral drusen, shallow schisis and +SRF  IT (0700-0900) and bullous schisis ST periphery (1000-1130) -- good laser surrounding all lesions from 0700 to 1130 -- good fill in changes at 0800-0900; no new RT/RD or schisis Attached, temporal retinoschisis w/ret hole and focal SRF at 0300 -- schisis detachment -- good laser changes surrounding from 0100-0430, no new RT/RD or schisis, mild peripheral drusen, no heme           Refraction     Wearing Rx       Sphere Cylinder Axis Add   Right +2.50 +1.50 174 +2.25   Left +2.00 +1.00 177 +2.25           IMAGING AND PROCEDURES  Imaging and Procedures for 07/31/2023  OCT, Retina - OU - Both Eyes       Right Eye Quality was good. Central Foveal Thickness: 301. Progression has  been stable. Findings include normal foveal contour, no IRF, no SRF, vitreomacular adhesion (Shallow SRF IT periphery caught on widefield;  Bullous schisis ST periphery -- not imaged today).   Left Eye Quality was good. Central Foveal Thickness: 260. Progression has been stable. Findings include normal foveal contour, no IRF, subretinal fluid, vitreomacular adhesion (+Shallow SRF and schisis ST and IT periphery caught on widefield--not imaged today ).   Notes *Images captured and stored on drive  Diagnosis / Impression:  NFP; no IRF/SRF centrally OU OD: Shallow SRF IT periphery caught on widefield; Bullous schisis ST periphery -- not imaged today OS: Shallow SRF within schisis cavity -- ST and IT periphery -- stable  Clinical management:  See below  Abbreviations: NFP - Normal foveal profile. CME - cystoid macular edema. PED - pigment epithelial detachment. IRF - intraretinal fluid. SRF - subretinal fluid. EZ - ellipsoid zone. ERM - epiretinal membrane. ORA - outer retinal atrophy. ORT - outer retinal tubulation. SRHM - subretinal hyper-reflective material. 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 Bilateral retinal detachment  H33.23     3. Essential hypertension  I10     4. Hypertensive retinopathy of both eyes  H35.033     5. Combined forms of age-related cataract of both eyes  H25.813      1,2. 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          - OD: Shallow schisis and SRF IT (9299-9169) and bullous schisis ST periphery (1000-1130)--not imaged today - OS: Schisis detachment -- temporal schisis w/ ret hole and focal SRF @ 0300 -- good laser changes surrounding from (0100-0430)--not imaged today - s/p laser retinopexy OS (10.04.22) -- good laser changes in place - s/p laser retinopexy OD (10.25.22), touch-up (02.15.23) for schisis RD -- good  laser changes in place from 0700-1130 -- good fill in laser from 0800-0900          - VA 20/20 OU, pt remains asymptomatic  - no new RT/RD or lattice  - Reviewed s/s of RT/RD  - Strict return precautions for any such RT/RD signs/symptoms          - f/u 9 months, sooner prn, DFE, OCT, scan thru schisis OU  3,4. Hypertensive retinopathy OU - discussed importance of tight BP control - monitor  5. Mixed Cataract OU - The symptoms of cataract, surgical options, and treatments and risks were discussed with patient. - discussed diagnosis and progression  - mild progression from prior  - monitor  Ophthalmic Meds Ordered this visit:  No orders of the defined types were placed in this encounter.    Return for 9-12 mo schisis OU, DFE, OCT, scan through schisis OU.  There are no Patient Instructions on file for this visit.  This document serves as a record of services personally performed by Redell JUDITHANN Hans, MD, PhD. It was created on their behalf by Delon Newness COT, an ophthalmic technician. The creation of this record is the provider's dictation and/or activities during the visit.    Electronically signed by: Delon Newness COT 07.15.2025 3:23 PM   This document serves as a record of services personally performed by Redell JUDITHANN Hans, MD, PhD. It was created on their behalf by Almetta Pesa, an ophthalmic technician. The creation of this record is the provider's dictation and/or activities during the visit.    Electronically signed by: Almetta Pesa, OA, 08/02/23  3:23 PM  Redell JUDITHANN Hans,  M.D., Ph.D. Diseases & Surgery of the Retina and Vitreous [facility] 07/31/2023    I have reviewed the above documentation for accuracy and completeness, and I agree with the above. Redell JUDITHANN Hans, M.D., Ph.D. 08/02/23 3:26 PM   Abbreviations: M myopia (nearsighted); A astigmatism; H hyperopia (farsighted); P presbyopia; Mrx spectacle prescription;  CTL  contact lenses; OD right eye; OS left eye; OU both eyes  XT exotropia; ET esotropia; PEK punctate epithelial keratitis; PEE punctate epithelial erosions; DES dry eye syndrome; MGD meibomian gland dysfunction; ATs artificial tears; PFAT's preservative free artificial tears; NSC nuclear sclerotic cataract; PSC posterior subcapsular cataract; ERM epi-retinal membrane; PVD posterior vitreous detachment; RD retinal detachment; DM diabetes mellitus; DR diabetic retinopathy; NPDR non-proliferative diabetic retinopathy; PDR proliferative diabetic retinopathy; CSME clinically significant macular edema; DME diabetic macular edema; dbh dot blot hemorrhages; CWS cotton wool spot; POAG primary open angle glaucoma; C/D cup-to-disc ratio; HVF humphrey visual field; GVF goldmann visual field; OCT optical coherence tomography; IOP intraocular pressure; BRVO Branch retinal vein occlusion; CRVO central retinal vein occlusion; CRAO central retinal artery occlusion; BRAO branch retinal artery occlusion; RT retinal tear; SB scleral buckle; PPV pars plana vitrectomy; VH Vitreous hemorrhage; PRP panretinal laser photocoagulation; IVK intravitreal kenalog; VMT vitreomacular traction; MH Macular hole;  NVD neovascularization of the disc; NVE neovascularization elsewhere; AREDS age related eye disease study; ARMD age related macular degeneration; POAG primary open angle glaucoma; EBMD epithelial/anterior basement membrane dystrophy; ACIOL anterior chamber intraocular lens; IOL intraocular lens; PCIOL posterior chamber intraocular lens; Phaco/IOL phacoemulsification with intraocular lens placement; PRK photorefractive keratectomy; LASIK laser assisted in situ keratomileusis; HTN hypertension; DM diabetes mellitus; COPD chronic obstructive pulmonary disease

## 2023-07-31 ENCOUNTER — Ambulatory Visit (INDEPENDENT_AMBULATORY_CARE_PROVIDER_SITE_OTHER): Admitting: Ophthalmology

## 2023-07-31 ENCOUNTER — Encounter (INDEPENDENT_AMBULATORY_CARE_PROVIDER_SITE_OTHER): Payer: Self-pay | Admitting: Ophthalmology

## 2023-07-31 DIAGNOSIS — H33103 Unspecified retinoschisis, bilateral: Secondary | ICD-10-CM

## 2023-07-31 DIAGNOSIS — H25813 Combined forms of age-related cataract, bilateral: Secondary | ICD-10-CM | POA: Diagnosis not present

## 2023-07-31 DIAGNOSIS — H35033 Hypertensive retinopathy, bilateral: Secondary | ICD-10-CM

## 2023-07-31 DIAGNOSIS — H3323 Serous retinal detachment, bilateral: Secondary | ICD-10-CM

## 2023-07-31 DIAGNOSIS — I10 Essential (primary) hypertension: Secondary | ICD-10-CM | POA: Diagnosis not present

## 2023-08-02 ENCOUNTER — Encounter (INDEPENDENT_AMBULATORY_CARE_PROVIDER_SITE_OTHER): Payer: Self-pay | Admitting: Ophthalmology

## 2023-08-04 ENCOUNTER — Ambulatory Visit: Attending: Internal Medicine | Admitting: Internal Medicine

## 2023-08-04 ENCOUNTER — Encounter: Payer: Self-pay | Admitting: Internal Medicine

## 2023-08-04 VITALS — BP 185/89 | HR 68 | Ht 66.0 in | Wt 206.6 lb

## 2023-08-04 DIAGNOSIS — R030 Elevated blood-pressure reading, without diagnosis of hypertension: Secondary | ICD-10-CM | POA: Diagnosis not present

## 2023-08-04 DIAGNOSIS — M791 Myalgia, unspecified site: Secondary | ICD-10-CM

## 2023-08-04 DIAGNOSIS — T466X5D Adverse effect of antihyperlipidemic and antiarteriosclerotic drugs, subsequent encounter: Secondary | ICD-10-CM

## 2023-08-04 DIAGNOSIS — E785 Hyperlipidemia, unspecified: Secondary | ICD-10-CM

## 2023-08-04 NOTE — Patient Instructions (Signed)
 Medication Instructions:  Your physician recommends that you continue on your current medications as directed. Please refer to the Current Medication list given to you today.  *If you need a refill on your cardiac medications before your next appointment, please call your pharmacy*  Lab Work: FASTING Lipids- NMR in 4 months (November 2025) If you have labs (blood work) drawn today and your tests are completely normal, you will receive your results only by: MyChart Message (if you have MyChart) OR A paper copy in the mail If you have any lab test that is abnormal or we need to change your treatment, we will call you to review the results.  Follow-Up: At [facility], you and your health needs are our priority.  As part of our continuing mission to provide you with exceptional heart care, our providers are all part of one team.  This team includes your primary Cardiologist (physician) and Advanced Practice Providers or APPs (Physician Assistants and Nurse Practitioners) who all work together to provide you with the care you need, when you need it.  Your next appointment:   TBD with Dr. Mona   HOW TO TAKE YOUR BLOOD PRESSURE: Rest 5 minutes before taking your blood pressure. Don't smoke or drink caffeinated beverages for at least 30 minutes before. Take your blood pressure before (not after) you eat. Sit comfortably with your back supported and both feet on the floor (don't cross your legs). Elevate your arm to heart level on a table or a desk. Use the proper sized cuff. It should fit smoothly and snugly around your bare upper arm. There should be enough room to slip a fingertip under the cuff. The bottom edge of the cuff should be 1 inch above the crease of the elbow. Ideally, take 3 measurements at one sitting and record the average.   Blood Pressure Log   Date   Time  Blood Pressure  Position  Example: Nov 1 9 AM 124/78 sitting

## 2023-08-04 NOTE — Progress Notes (Signed)
 LIPID CLINIC CONSULT NOTE  Chief Complaint:  Follow-up dyslipidemia  Primary Care Physician: Leonel Cole, MD  Primary Cardiologist:  None  HPI:  Heather Allison is a 73 y.o. female who is being seen today for the evaluation of dyslipidemia at the request of Leonel Cole, MD. this is a pleasant 73 year old female who with a history of dyslipidemia.  Unfortunately she has been statin intolerant.  She is tried 3 different statins in the past all of which caused significant side effects.  This included simvastatin, atorvastatin and rosuvastatin.  She also had tried gemfibrozil which caused her heart to race.  More recently she had repeat lipids which showed total cholesterol of 220, HDL 44, triglycerides 283 and LDL 127.  She has no known coronary disease but does have some aortic atherosclerosis.  There is a family history of heart disease in her father who had an MI in his 81s and bypass surgery afterwards and died in his 38s.  She reports fairly low-fat diet and gets intermittent exercise, typically when she goes to the beach she walks regularly but not when she is at home.  09/11/2021  [name] returns today for follow-up.  Unfortunately she was not able to tolerate Livalo .  At this point I would consider her statin intolerant.  Her last lipids recently showed total cholesterol 215, triglycerides 190, HDL 47 and LDL 134.  She does have some aortic atherosclerosis but no known cardiovascular disease.  I do think we need to conceptualize this better to determine her targets of therapy.  01/02/2022  [name] returns today for follow-up.  She underwent coronary calcium  scoring which showed an elevated calcium  score of 36.5, 62nd percentile for age and sex matched controls.  This was predominantly in the LAD.  There were however some extracardiac findings including a suspicious 7 mm left solid pulmonary nodule within the upper lobe.  A repeat CT scan was recommended to rule out possible  malignancy.  We have already ordered this to be repeated in 6 months.  Additionally there was distal esophageal thickening concerning for possible esophagitis or infection.  I would advise GI evaluation of this.  Aortic atherosclerosis was noted.  05/06/2022  [name] is seen today in follow-up.  She said she has been fairly ill thus far during the year.  She had COVID in Christmas and has had issues with allergies and upper respiratory symptoms.  She recently was on steroids and had some tachycardia associated with that.  She had a follow-up CT scan which shows stable pulmonary nodules however some new basilar nodules were noted that could not be compared because the field-of-view was limited on the previous CT scan.  A conservative recommendation was to repeat that scan in 1 year.  She does have a remote smoking history.  She had repeat lipids which showed a normal LP(a) 27.4 nmol/L.  Her cholesterol has improved somewhat however not at target with particle #1233, LDL 117, HDL 61 and triglycerides 184.  Liver enzymes were normal.  She is on Zetia  monotherapy.  She could not tolerate statins.  08/04/2023  [name] is seen today in follow-up.  Overall she seems to be doing pretty well.  Her blood pressure was surprisingly elevated today.  She is not on any blood pressure medications.  The initial check was 185/89 and a repeat came down to 160/80.  She had recent lipid testing which showed a particle number of 1233, LDL 117, HDL 61 and triglycerides 184.  She is tolerating ezetimibe  which has caused some improvement in her lipids however remains above a target LDL less than 70.  PMHx:  Past Medical History:  Diagnosis Date   Cataract    Diverticulitis 09/2018   Hypertensive retinopathy    Hypothyroidism    PONV (postoperative nausea and vomiting)    Varicose veins    Wears glasses     Past Surgical History:  Procedure Laterality Date   ABDOMINAL HYSTERECTOMY     APPENDECTOMY  23009    lap appendix-rt adrenal mass   BACK SURGERY  01/2018   HERNIATED DISK   CHOLECYSTECTOMY  1997   COLONOSCOPY     FOOT SURGERY  2013   right-plantar    KNEE ARTHROSCOPY WITH MEDIAL MENISECTOMY Left 08/25/2013   Procedure: KNEE ARTHROSCOPY WITH DEBRIDEMENT/SHAVING (CHONDROPLASTY),WITH MENISCECTOMY MEDIAL;  Surgeon: Toribio JULIANNA Chancy, MD;  Location: [facility];  Service: Orthopedics;  Laterality: Left;   ovaries removed  2009   rt overian cyst-lt ovary out   RECTAL SURGERY     torn tissue   ROTATOR CUFF REPAIR      FAMHx:  History reviewed. No pertinent family history.  SOCHx:   reports that she quit smoking about 15 years ago. Her smoking use included cigarettes. She has never used smokeless tobacco. She reports that she does not drink alcohol and does not use drugs.  ALLERGIES:  Allergies  Allergen Reactions   Statins Other (See Comments)   Doxycycline Nausea And Vomiting   Hydrocodone Nausea And Vomiting   Latex    Morphine And Codeine    Codeine Nausea And Vomiting   Erythromycin Nausea And Vomiting    And rash    ROS: Pertinent items noted in HPI and remainder of comprehensive ROS otherwise negative.  HOME MEDS: Current Outpatient Medications on File Prior to Visit  Medication Sig Dispense Refill   acetaminophen  (TYLENOL ) 325 MG tablet Take 2 tablets (650 mg total) by mouth every 6 (six) hours as needed for mild pain (or temp > 100).     ezetimibe  (ZETIA ) 10 MG tablet TAKE 1 TABLET BY MOUTH EVERY DAY 90 tablet 0   fluticasone (FLONASE) 50 MCG/ACT nasal spray as needed.     levothyroxine  (SYNTHROID ) 112 MCG tablet Take 112 mcg by mouth every morning.     Misc Natural Products (ELDERBERRY/VITAMIN C/ZINC) CHEW Chew by mouth.     PROBIOTIC PRODUCT PO Take by mouth.     No current facility-administered medications on file prior to visit.    LABS/IMAGING: No results found for this or any previous visit (from the past 48 hours). No results found.  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   Ms.  Shambaugh seems to be doing well although her cholesterol remains above target.  I advised adding Nexletol to her ezetimibe  for further lipid lowering.  She is also been working on diet lifestyle changes as well.  Will plan repeat lipids in about 4 months and I will contact her with those results.  Otherwise plan follow-up with me annually or sooner as necessary.  Vinie KYM Maxcy, MD, [facility], FNLA, FACP  [place]  [facility]  Medical Director of the Advanced Lipid Disorders &  Cardiovascular Risk Reduction Clinic Diplomate of the American Board of Clinical Lipidology Attending Cardiologist  Direct Dial: [phone]  Fax: [phone]  Website:  [email or web address]   Vinie JAYSON Maxcy 08/04/2023, 9:18 PM

## 2023-09-02 ENCOUNTER — Encounter: Payer: Self-pay | Admitting: Internal Medicine

## 2023-09-02 DIAGNOSIS — Z79899 Other long term (current) drug therapy: Secondary | ICD-10-CM

## 2023-09-02 MED ORDER — TELMISARTAN 40 MG PO TABS: 40.0000 mg | ORAL_TABLET | Freq: Every day | ORAL | 3 refills | Status: AC

## 2023-09-03 DIAGNOSIS — Z6833 Body mass index (BMI) 33.0-33.9, adult: Secondary | ICD-10-CM | POA: Diagnosis not present

## 2023-09-03 DIAGNOSIS — E669 Obesity, unspecified: Secondary | ICD-10-CM | POA: Diagnosis not present

## 2023-09-03 DIAGNOSIS — R350 Frequency of micturition: Secondary | ICD-10-CM | POA: Diagnosis not present

## 2023-10-02 ENCOUNTER — Other Ambulatory Visit: Payer: Self-pay | Admitting: Internal Medicine

## 2023-10-06 ENCOUNTER — Other Ambulatory Visit: Payer: Self-pay | Admitting: Gastroenterology

## 2023-10-06 DIAGNOSIS — R140 Abdominal distension (gaseous): Secondary | ICD-10-CM

## 2023-10-06 DIAGNOSIS — R197 Diarrhea, unspecified: Secondary | ICD-10-CM | POA: Diagnosis not present

## 2023-10-12 ENCOUNTER — Ambulatory Visit
Admission: RE | Admit: 2023-10-12 | Discharge: 2023-10-12 | Disposition: A | Discharge status: Home / Self Care | Source: Ambulatory Visit | Attending: Gastroenterology | Admitting: Gastroenterology

## 2023-10-12 ENCOUNTER — Ambulatory Visit
Admission: RE | Admit: 2023-10-12 | Discharge: 2023-10-12 | Disposition: A | Source: Ambulatory Visit | Attending: Gastroenterology | Admitting: Gastroenterology

## 2023-10-12 DIAGNOSIS — K760 Fatty (change of) liver, not elsewhere classified: Secondary | ICD-10-CM | POA: Diagnosis not present

## 2023-10-12 DIAGNOSIS — R140 Abdominal distension (gaseous): Secondary | ICD-10-CM

## 2023-10-27 DIAGNOSIS — Z79899 Other long term (current) drug therapy: Secondary | ICD-10-CM | POA: Diagnosis not present

## 2023-10-27 LAB — BASIC METABOLIC PANEL WITH GFR
BUN/Creatinine Ratio: 14 (ref 12–28)
BUN: 9 mg/dL (ref 8–27)
CO2: 22 mmol/L (ref 20–29)
Calcium: 9.3 mg/dL (ref 8.7–10.3)
Chloride: 106 mmol/L (ref 96–106)
Creatinine, Ser: 0.66 mg/dL (ref 0.57–1.00)
Glucose: 99 mg/dL (ref 70–99)
Potassium: 4.6 mmol/L (ref 3.5–5.2)
Sodium: 142 mmol/L (ref 134–144)
eGFR: 94 mL/min/1.73 (ref >59)

## 2023-10-28 ENCOUNTER — Ambulatory Visit: Payer: Self-pay | Admitting: Internal Medicine

## 2023-10-28 DIAGNOSIS — R161 Splenomegaly, not elsewhere classified: Secondary | ICD-10-CM | POA: Diagnosis not present

## 2023-11-13 DIAGNOSIS — R319 Hematuria, unspecified: Secondary | ICD-10-CM | POA: Diagnosis not present

## 2023-11-13 DIAGNOSIS — K120 Recurrent oral aphthae: Secondary | ICD-10-CM | POA: Diagnosis not present

## 2024-07-29 ENCOUNTER — Encounter (INDEPENDENT_AMBULATORY_CARE_PROVIDER_SITE_OTHER): Admitting: Ophthalmology
